# Patient Record
Sex: Female | Born: 1965 | Race: White | Hispanic: No | Marital: Married | Smoking: Never smoker
Health system: Southern US, Community
[De-identification: ages and names within clinical notes are randomized; demographics above are authoritative.]

## PROBLEM LIST (undated history)

## (undated) DIAGNOSIS — I10 Essential (primary) hypertension: Secondary | ICD-10-CM

## (undated) HISTORY — DX: Essential (primary) hypertension: I10

---

## 1987-01-18 HISTORY — PX: BREAST BIOPSY: SHX20

## 2004-11-01 ENCOUNTER — Ambulatory Visit: Payer: Self-pay

## 2005-12-09 ENCOUNTER — Ambulatory Visit: Payer: Self-pay

## 2007-03-28 ENCOUNTER — Ambulatory Visit: Payer: Self-pay

## 2008-10-28 ENCOUNTER — Ambulatory Visit: Payer: Self-pay

## 2008-11-11 ENCOUNTER — Ambulatory Visit: Payer: Self-pay

## 2010-02-22 ENCOUNTER — Ambulatory Visit: Payer: Self-pay

## 2011-04-19 ENCOUNTER — Ambulatory Visit: Payer: Self-pay | Admitting: Obstetrics and Gynecology

## 2012-09-21 ENCOUNTER — Ambulatory Visit: Payer: Self-pay

## 2014-05-13 DIAGNOSIS — I10 Essential (primary) hypertension: Secondary | ICD-10-CM | POA: Insufficient documentation

## 2014-07-10 DIAGNOSIS — Z8639 Personal history of other endocrine, nutritional and metabolic disease: Secondary | ICD-10-CM | POA: Insufficient documentation

## 2014-07-10 DIAGNOSIS — E782 Mixed hyperlipidemia: Secondary | ICD-10-CM | POA: Insufficient documentation

## 2014-07-18 ENCOUNTER — Other Ambulatory Visit: Payer: Self-pay | Admitting: Certified Nurse Midwife

## 2014-07-18 DIAGNOSIS — Z1231 Encounter for screening mammogram for malignant neoplasm of breast: Secondary | ICD-10-CM

## 2014-07-25 ENCOUNTER — Ambulatory Visit: Payer: Self-pay

## 2014-08-08 ENCOUNTER — Ambulatory Visit
Admission: RE | Admit: 2014-08-08 | Discharge: 2014-08-08 | Disposition: A | Discharge status: Home / Self Care | Payer: Managed Care, Other (non HMO) | Source: Ambulatory Visit | Attending: Certified Nurse Midwife | Admitting: Certified Nurse Midwife

## 2014-08-08 ENCOUNTER — Ambulatory Visit (INDEPENDENT_AMBULATORY_CARE_PROVIDER_SITE_OTHER): Payer: Managed Care, Other (non HMO) | Admitting: Urology

## 2014-08-08 ENCOUNTER — Encounter: Payer: Self-pay | Admitting: Urology

## 2014-08-08 VITALS — BP 148/83 | HR 78 | Ht 63.0 in | Wt 152.9 lb

## 2014-08-08 DIAGNOSIS — R928 Other abnormal and inconclusive findings on diagnostic imaging of breast: Secondary | ICD-10-CM | POA: Insufficient documentation

## 2014-08-08 DIAGNOSIS — R339 Retention of urine, unspecified: Secondary | ICD-10-CM | POA: Diagnosis not present

## 2014-08-08 DIAGNOSIS — Z1231 Encounter for screening mammogram for malignant neoplasm of breast: Secondary | ICD-10-CM | POA: Diagnosis present

## 2014-08-08 DIAGNOSIS — R350 Frequency of micturition: Secondary | ICD-10-CM

## 2014-08-08 DIAGNOSIS — R312 Other microscopic hematuria: Secondary | ICD-10-CM

## 2014-08-08 DIAGNOSIS — R3129 Other microscopic hematuria: Secondary | ICD-10-CM

## 2014-08-08 LAB — URINALYSIS, COMPLETE
BILIRUBIN UA: NEGATIVE
Glucose, UA: NEGATIVE
NITRITE UA: POSITIVE — AB
PH UA: 8.5 — AB (ref 5.0–7.5)
Specific Gravity, UA: 1.02 (ref 1.005–1.030)
UUROB: 0.2 mg/dL (ref 0.2–1.0)

## 2014-08-08 LAB — MICROSCOPIC EXAMINATION

## 2014-08-08 LAB — BLADDER SCAN AMB NON-IMAGING

## 2014-08-08 NOTE — Patient Instructions (Signed)
Hematuria Hematuria is blood in your urine. It can be caused by a bladder infection, kidney infection, prostate infection, kidney stone, or cancer of your urinary tract. Infections can usually be treated with medicine, and a kidney stone usually will pass your health care provider about any blood you see in your urine, even if the blood stops without treatment or happens without causing pain. Blood in your urine that happens and then stops and then happens again  reason may be needed. It is very important that you tell your health care provider about any blood you see in your urine, even if the blood stops without treatment or happens without causing pain. Bloodcan be a symptom of a very serious condition. Also, pain is not a symptom in the initial stages of many urinary cancers. HOME CARE INSTRUCTIONS   Drink lots of fluid, 3-4 quarts a day. If you have been diagnosed with an infection, cranberry juice is especially recommended, in addition to large amounts of water.  Avoid caffeine, tea, and carbonated beverages because they tend to irritate the bladder.  Avoid alcohol because it may irritate the prostate.  Take all medicines as directed by your health care provider.  If you were prescribed an antibiotic medicine, finish it all even if you start to feel better.  If you have been diagnosed with a kidney stone, follow your health care provider's instructions regarding straining your urine to catch the stone.  Empty your bladder often. Avoid holding urine for long periods of time.  After a bowel movement, women should cleanse front to back. Use each tissue only once.  Empty your bladder before and after sexual intercourse if you are a female. SEEK MEDICAL CARE IF:  You develop back pain.  You have a fever.  You have a feeling of sickness in your stomach (nausea) or vomiting.  Your symptoms are not better in 3 days. Return sooner if you are getting worse. SEEK IMMEDIATE MEDICAL CARE IF:     You develop severe vomiting and are unable to keep the medicine down.  You develop severe back or abdominal pain despite taking your medicines.  You begin passing a large amount of blood or clots in your urine.  You feel extremely weak or faint, or you pass out. MAKE SURE YOU:   Understand these instructions.  Will watch your condition.  Will get help right away if you are not doing well or get worse. Document Released: 01/03/2005 Document Revised: 05/20/2013 Document Reviewed: 09/03/2012 [facility] Patient Information 2015 [place], [place]. This information is not intended to replace advice given to you by your health care provider. Make sure you discuss any questions you have with your health care provider.

## 2014-08-08 NOTE — Progress Notes (Signed)
08/08/2014 11:46 AM   Bonnie Ingram 1965-05-28 [id number]  Referring provider: No referring provider defined for this encounter.  Chief Complaint  Patient presents with  . Hematuria    New Patient    HPI: 49 yo referred evaluation of microscopic hematuria.  She has been found to have evidence of microscopic hematuria in the absence of true infection on 3 separate occasions by her PCP.  She does report developing strong smelling urine but no other symptoms starting in 04/2014.  A UA was performed showing incidental microscopic hematuria. Although UA was not particularly suspicious, she was treated for presumed infection on this occasion. No urine culture available. She's had 2 other subsequent UAs which also show presence of microscopic hematuria without infection.  No smoking history.  No personal history of kidney stones but does have family history of kidney stones.  No family history of bladder cancer or kidney cancers.    She does have some mild burning with urination and increased frequency, urgency, and foul-smelling urine. This is been present over the past 3 days and she thinks she may have an infection.  At baseline, she denies any significant urinary symptoms other than occasional urinary frequency. She reports that she sometimes goes to the bathroom and will have to urinate just a few minutes thereafter. She is not sure that she is emptying her bladder fully.  She denies urinary incontinence.  UA results  07/17/2014 07/02/2014 05/09/2014    Light Yellow (A) Yellow Yellow  Clear Clear Clear  1.015 1.010 1.015  6.0 6.0 7.0  Negative Negative Negative  Negative Negative Negative  Negative Negative Negative  Large (A) Moderate (A) Moderate (A)  Negative Negative Negative  Small (A) Negative Negative  None Seen 0-3 0-3  4-10 (A) 4-10 (A) 4-10 (A)  Rare (A) None Seen None Seen  Rare None Seen Few   Color  Clarity  Specific Gravity  pH, Urine  Protein, Urinalysis    Glucose, Urinalysis  Ketones, Urinalysis  Blood, Urinalysis  Nitrite, Urinalysis  Leukocyte Esterase, Urinalysis  White Blood Cells, Urinalysis  Red Blood Cells, Urinalysis  Bacteria, Urinalysis  Squamous Epithelial Cells, Urinalysis      PMH: Past Medical History  Diagnosis Date  . Hypertension     Surgical History: Past Surgical History  Procedure Laterality Date  . Breast biopsy Right 1989    Benign  . Cesarean section  1993    Home Medications:    Medication List       This list is accurate as of: 08/08/14 11:59 PM.  Always use your most recent med list.               lisinopril-hydrochlorothiazide 20-12.5 MG per tablet  Commonly known as:  PRINZIDE,ZESTORETIC  Take by mouth.        Allergies: No Known Allergies  Family History: Family History  Problem Relation Age of Onset  . Breast cancer Maternal Aunt   . Bladder Cancer Maternal Uncle   . Nephrolithiasis Father   . Nephrolithiasis Maternal Grandfather   . Nephrolithiasis Sister     Social History:  reports that she has never smoked. She does not have any smokeless tobacco history on file. She reports that she does not drink alcohol or use illicit drugs.  ROS: UROLOGY Frequent Urination?: Yes Hard to postpone urination?: No Burning/pain with urination?: Yes Get up at night to urinate?: No Leakage of urine?: No Urine stream starts and stops?: Yes Trouble starting stream?: Yes Do  you have to strain to urinate?: Yes Blood in urine?: Yes Urinary tract infection?: Yes Sexually transmitted disease?: No Injury to kidneys or bladder?: No Painful intercourse?: No Weak stream?: No Currently pregnant?: No Vaginal bleeding?: No Last menstrual period?: 07-22-14  Gastrointestinal Nausea?: No Vomiting?: No Indigestion/heartburn?: No Diarrhea?: No Constipation?: Yes  Constitutional Fever: No Night sweats?: No Weight loss?: No Fatigue?: Yes  Skin Skin rash/lesions?: No Itching?:  No  Eyes Blurred vision?: No Double vision?: No  Ears/Nose/Throat Sore throat?: No Sinus problems?: No  Hematologic/Lymphatic Swollen glands?: No Easy bruising?: No  Cardiovascular Leg swelling?: No Chest pain?: No  Respiratory Cough?: No Shortness of breath?: No  Endocrine Excessive thirst?: No  Musculoskeletal Back pain?: No Joint pain?: No  Neurological Headaches?: No Dizziness?: No  Psychologic Depression?: No Anxiety?: No  Physical Exam: BP 148/83 mmHg  Pulse 78  Ht $R'5\' 3"'dr$  (1.6 m)  Wt 152 lb 14.4 oz (69.355 kg)  BMI 27.09 kg/m2  LMP 07/22/2014  Constitutional:  Alert and oriented, No acute distress. HEENT: [place] AT, moist mucus membranes.  Trachea midline, no masses. Cardiovascular: No clubbing, cyanosis, or edema. Respiratory: Normal respiratory effort, no increased work of breathing. GI: Abdomen is soft, nontender, nondistended, no abdominal masses GU: No CVA tenderness.  Skin: No rashes, bruises or suspicious lesions. Lymph: No cervical or inguinal adenopathy. Neurologic: Grossly intact, no focal deficits, moving all 4 extremities. Psychiatric: Normal mood and affect.  Laboratory Data: Comprehensive Metabolic Panel (CMP) - Final result (07/02/2014 8:41 AM) Comprehensive Metabolic Panel (CMP) - Final result (07/02/2014 8:41 AM)  Component Value Range  Glucose 87 70-110 mg/dL  Sodium 141 136-145 mmol/L  Potassium 4.3 3.6-5.1 mmol/L  Chloride 105 97-109 mmol/L  Carbon Dioxide (CO2) 26.5 22.0-32.0 mmol/L  Urea Nitrogen (BUN) 11 7-25 mg/dL  Creatinine 0.9 0.6-1.1 mg/dL  Glomerular Filtration Rate (eGFR), MDRD Estimate 67 >60 mL/min/1.73sq m  Calcium 10.0 8.7-10.3 mg/dL  AST  24 8-39 U/L  ALT  17 5-38 U/L  Alk Phos (alkaline Phosphatase) 44 34-104 U/L  Albumin 4.4 3.5-4.8 g/dL  Bilirubin, Total 0.5 0.3-1.2 mg/dL  Protein, Total 7.0 6.1-7.9 g/dL  A/G Ratio 1.7 1.0-5.0 gm/dL    Urinalysis Results for orders placed or performed in visit on  08/08/14  Microscopic Examination  Result Value Ref Range   WBC, UA 11-30 (A) 0 -  5 /hpf   RBC, UA 3-10 (A) 0 -  2 /hpf   Epithelial Cells (non renal) 0-10 0 - 10 /hpf   Bacteria, UA Many (A) None seen/Few  Urinalysis, Complete  Result Value Ref Range   Specific Gravity, UA 1.020 1.005 - 1.030   pH, UA 8.5 (H) 5.0 - 7.5   Color, UA Yellow Yellow   Appearance Ur Cloudy (A) Clear   Leukocytes, UA 1+ (A) Negative   Protein, UA 1+ (A) Negative/Trace   Glucose, UA Negative Negative   Ketones, UA 1+ (A) Negative   RBC, UA 2+ (A) Negative   Bilirubin, UA Negative Negative   Urobilinogen, Ur 0.2 0.2 - 1.0 mg/dL   Nitrite, UA Positive (A) Negative   Microscopic Examination See below:   BLADDER SCAN AMB NON-IMAGING  Result Value Ref Range   Scan Result 142ml     Pertinent Imaging: PVR 144 cc  Assessment & Plan: 49 year old nonsmoker with microscopic hematuria 3 in the absence of infection. Her UA today though is suspicious for infection and we will treat as needed.  1. Microscopic hematuria She does meet the AUA criteria for  workup. We discussed the differential diagnosis of microscopic hematuria today as well as the recommended follow-up including CT urogram and office cystoscopy. All of her questions were answered. - Urinalysis, Complete  2. Urinary frequency Mild urinary frequency and incomplete bladder emptying. See below. - BLADDER SCAN AMB NON-IMAGING  3. Incomplete bladder emptying Postvoid residual today is 144 which is elevated, etiology unclear. We'll perform pelvic exam at the time of cystoscopy to evaluate for presence of cystocele or any other anatomic issues. She was advised to double void as needed.   Return in about 4 weeks (around 09/05/2014) for cytoscopy, pelvic exam, review CTU.  Hollice Espy, MD  [facility] [address], [place], [place] [address] [phone]

## 2014-08-09 ENCOUNTER — Encounter: Payer: Self-pay | Admitting: Urology

## 2014-08-11 ENCOUNTER — Telehealth: Payer: Self-pay | Admitting: Urology

## 2014-08-11 LAB — CULTURE, URINE COMPREHENSIVE

## 2014-08-11 MED ORDER — CIPROFLOXACIN HCL 500 MG PO TABS: 500.0000 mg | ORAL_TABLET | Freq: Two times a day (BID) | ORAL | Status: DC

## 2014-08-11 NOTE — Telephone Encounter (Signed)
LMOM-medication called into pharmacy 

## 2014-08-11 NOTE — Telephone Encounter (Signed)
Please call and treat UTI with cipro 500 mg bid x 3 days.  Vanna [place], MD

## 2014-08-12 NOTE — Telephone Encounter (Signed)
LMOM

## 2014-08-13 ENCOUNTER — Telehealth: Payer: Self-pay

## 2014-08-13 NOTE — Telephone Encounter (Signed)
Unable to reach pt. Nurse called pt pharmacy who noted medication has been picked up.

## 2014-08-13 NOTE — Telephone Encounter (Signed)
Pt returned call stating she had picked up medication and is taking it.

## 2014-08-15 ENCOUNTER — Other Ambulatory Visit: Payer: Self-pay | Admitting: Certified Nurse Midwife

## 2014-08-15 DIAGNOSIS — R928 Other abnormal and inconclusive findings on diagnostic imaging of breast: Secondary | ICD-10-CM

## 2014-08-15 DIAGNOSIS — N6310 Unspecified lump in the right breast, unspecified quadrant: Secondary | ICD-10-CM

## 2014-08-18 ENCOUNTER — Other Ambulatory Visit: Payer: Self-pay | Admitting: Certified Nurse Midwife

## 2014-08-18 DIAGNOSIS — N63 Unspecified lump in unspecified breast: Secondary | ICD-10-CM

## 2014-08-18 DIAGNOSIS — R928 Other abnormal and inconclusive findings on diagnostic imaging of breast: Secondary | ICD-10-CM

## 2014-08-20 ENCOUNTER — Ambulatory Visit
Admission: RE | Admit: 2014-08-20 | Discharge: 2014-08-20 | Disposition: A | Discharge status: Home / Self Care | Payer: Managed Care, Other (non HMO) | Source: Ambulatory Visit | Attending: Certified Nurse Midwife | Admitting: Certified Nurse Midwife

## 2014-08-20 DIAGNOSIS — N63 Unspecified lump in unspecified breast: Secondary | ICD-10-CM

## 2014-08-20 DIAGNOSIS — R928 Other abnormal and inconclusive findings on diagnostic imaging of breast: Secondary | ICD-10-CM

## 2014-08-26 ENCOUNTER — Other Ambulatory Visit: Payer: Managed Care, Other (non HMO)

## 2014-08-26 ENCOUNTER — Ambulatory Visit: Payer: Managed Care, Other (non HMO)

## 2014-08-27 ENCOUNTER — Other Ambulatory Visit: Payer: Self-pay | Admitting: Internal Medicine

## 2014-08-27 DIAGNOSIS — R3129 Other microscopic hematuria: Secondary | ICD-10-CM

## 2014-08-29 ENCOUNTER — Other Ambulatory Visit: Payer: Self-pay | Admitting: Internal Medicine

## 2014-08-29 ENCOUNTER — Ambulatory Visit
Admission: RE | Admit: 2014-08-29 | Discharge: 2014-08-29 | Disposition: A | Discharge status: Home / Self Care | Payer: Managed Care, Other (non HMO) | Source: Ambulatory Visit | Attending: Internal Medicine | Admitting: Internal Medicine

## 2014-08-29 DIAGNOSIS — R911 Solitary pulmonary nodule: Secondary | ICD-10-CM

## 2014-08-29 DIAGNOSIS — R312 Other microscopic hematuria: Secondary | ICD-10-CM | POA: Diagnosis present

## 2014-08-29 DIAGNOSIS — R3129 Other microscopic hematuria: Secondary | ICD-10-CM

## 2014-08-29 MED ORDER — IOHEXOL 350 MG/ML SOLN
125.0000 mL | Freq: Once | INTRAVENOUS | Status: AC | PRN
  Administered 2014-08-29: 125 mL via INTRAVENOUS

## 2014-09-11 ENCOUNTER — Other Ambulatory Visit: Payer: Managed Care, Other (non HMO)

## 2014-12-02 ENCOUNTER — Ambulatory Visit: Admission: RE | Admit: 2014-12-02 | Payer: Managed Care, Other (non HMO) | Source: Ambulatory Visit

## 2015-07-15 ENCOUNTER — Other Ambulatory Visit: Payer: Self-pay | Admitting: Internal Medicine

## 2015-07-15 DIAGNOSIS — R911 Solitary pulmonary nodule: Secondary | ICD-10-CM

## 2015-07-22 ENCOUNTER — Ambulatory Visit: Payer: Managed Care, Other (non HMO)

## 2015-08-04 ENCOUNTER — Ambulatory Visit: Admission: RE | Admit: 2015-08-04 | Payer: Managed Care, Other (non HMO) | Source: Ambulatory Visit

## 2015-12-07 ENCOUNTER — Other Ambulatory Visit: Payer: Self-pay | Admitting: Certified Nurse Midwife

## 2015-12-07 DIAGNOSIS — N63 Unspecified lump in unspecified breast: Secondary | ICD-10-CM

## 2016-01-01 ENCOUNTER — Ambulatory Visit
Admission: RE | Admit: 2016-01-01 | Discharge: 2016-01-01 | Disposition: A | Discharge status: Home / Self Care | Payer: Managed Care, Other (non HMO) | Source: Ambulatory Visit | Attending: Certified Nurse Midwife | Admitting: Certified Nurse Midwife

## 2016-01-01 DIAGNOSIS — N63 Unspecified lump in unspecified breast: Secondary | ICD-10-CM

## 2016-01-01 DIAGNOSIS — N6310 Unspecified lump in the right breast, unspecified quadrant: Secondary | ICD-10-CM | POA: Insufficient documentation

## 2016-01-18 HISTORY — PX: TOOTH EXTRACTION: SUR596

## 2016-03-18 IMAGING — US US BREAST LTD UNI RIGHT INC AXILLA
1 series · 2 of 2 positions shown · non-contrast
Comparison: 08/08/2014, 09/21/2012, additional prior studies dating
back to 11/01/2004

CLINICAL DATA: 49-year-old female, callback from screening
mammogram for possible right breast mass. The patient has a history
of a prior benign right excisional biopsy in her 20s demonstrating a
fibroadenoma.

EXAM:
DIGITAL DIAGNOSTIC RIGHT MAMMOGRAM WITH 3D TOMOSYNTHESIS WITH CAD
ULTRASOUND RIGHT BREAST

[Series 1: us breast ltd uni right inc axilla · 0.08mm/px · 2 of 2 slices shown]
[im 1/2]
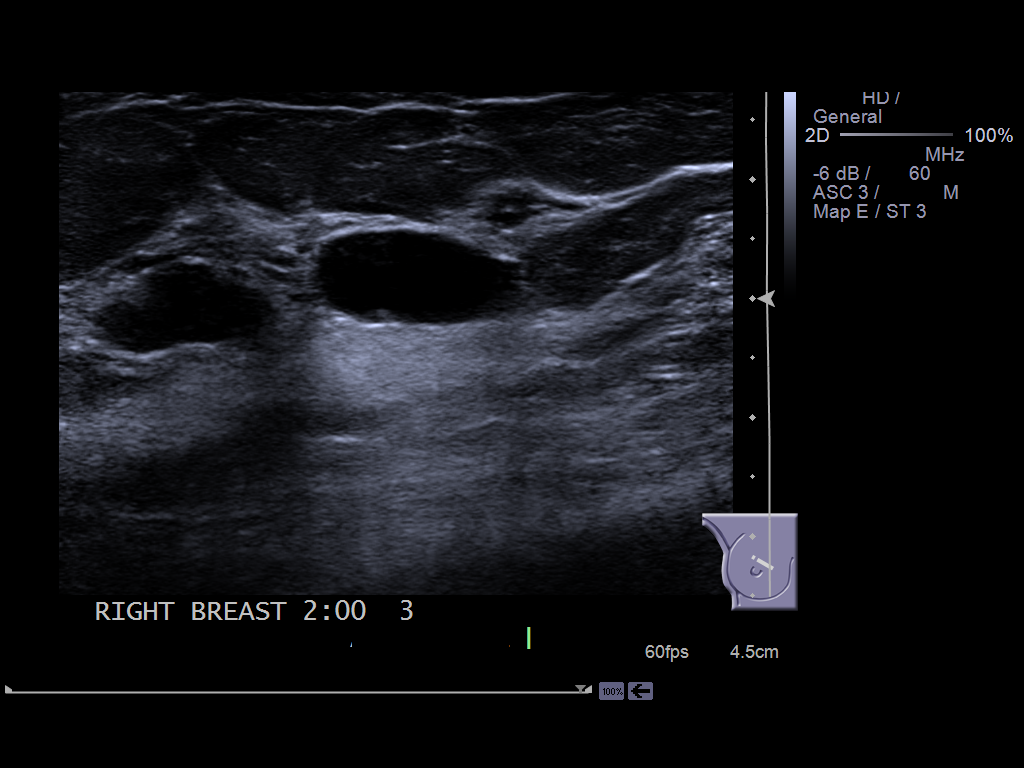
[im 2/2]
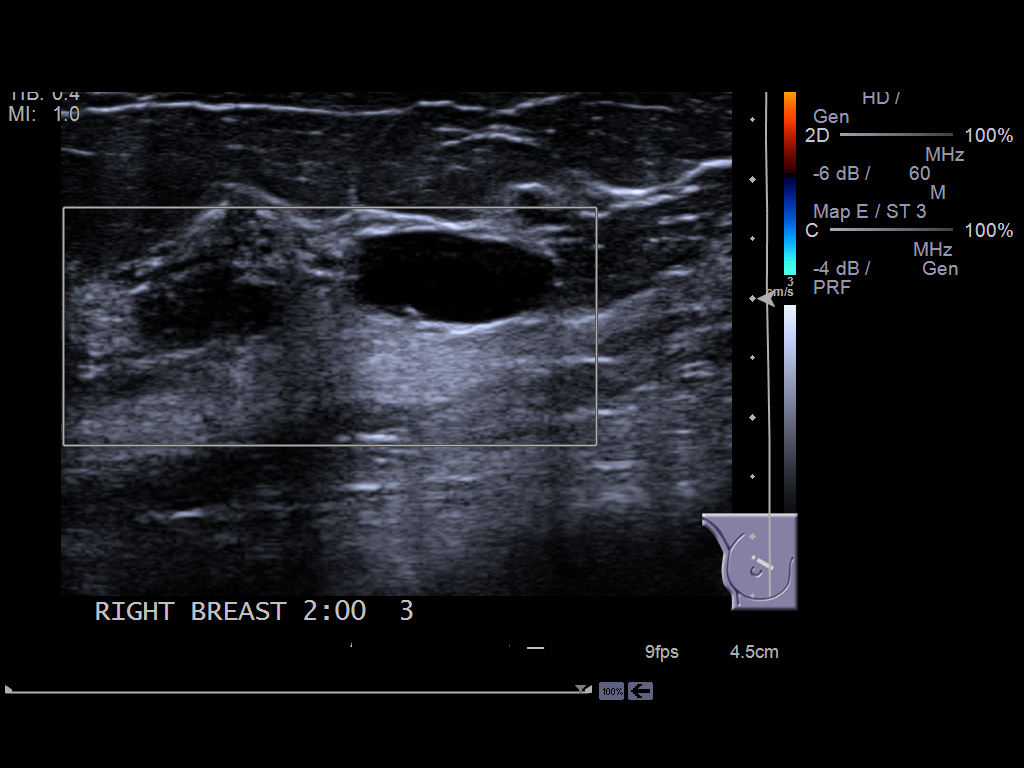

[2 of 2 positions shown; findings below may reference images not displayed]

ACR Breast Density Category c: The breast tissue is heterogeneously
dense, which may obscure small masses.
FINDINGS: Cc and MLO views of the right breast with tomosynthesis were
performed. In the area of concern identified on screening mammogram,
there are several adjacent oval circumscribed masses. Postsurgical
changes from prior excisional biopsy are noted within the lateral
right breast.

Mammographic images were processed with CAD.

On physical exam, no discrete mass is felt in the area of concern
within the medial right breast.

Targeted ultrasound is performed, showing a cyst at 2 o'clock, 3 cm
from the nipple measuring 18 x 8 x 19 mm, corresponding to the
largest oval mass within the medial right breast on mammogram.
Adjacent to this cyst at 12 o'clock, 3 cm from the nipple, there is
an oval, circumscribed, hypoechoic mass measuring 16 x 15 x 7 mm,
corresponding to the slightly smaller oval mass seen
mammographically within the medial right breast. This mass is
thought to likely represent a fibroadenoma. An additional cyst is
noted at 12 o'clock, 1 cm from the nipple measuring 6 x 6 x 7 mm. No
suspicious sonographic finding is identified.
IMPRESSION: Probably benign right breast mass at 12 o'clock, 3 cm from the
nipple.

RECOMMENDATION:
Right diagnostic mammogram and possible ultrasound in 6 months.

I have discussed the findings and recommendations with the patient.
Results were also provided in writing at the conclusion of the
visit. If applicable, a reminder letter will be sent to the patient
regarding the next appointment.

BI-RADS CATEGORY  3: Probably benign.

## 2017-07-30 IMAGING — US US BREAST*R* LIMITED INC AXILLA
1 series · 8 of 8 positions shown · non-contrast
Comparison: Previous exam(s).

CLINICAL DATA: This patient was to have a short-term followup for a
probably benign mass in the right breast. This mass was evaluated in
August 2014. This is her first follow-up since that exam.

EXAM:
2D DIGITAL DIAGNOSTIC BILATERAL MAMMOGRAM WITH CAD AND ADJUNCT TOMO
ULTRASOUND RIGHT BREAST

[Series 1: us breast*right* limited inc axilla · 0.07mm/px · 8 of 8 slices shown]
[im 1/8]
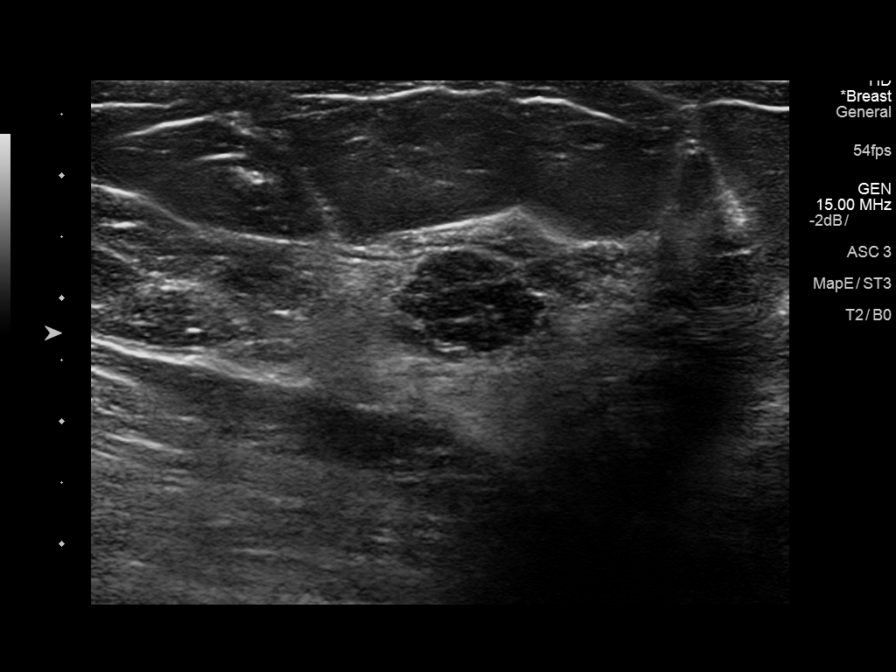
[im 2/8]
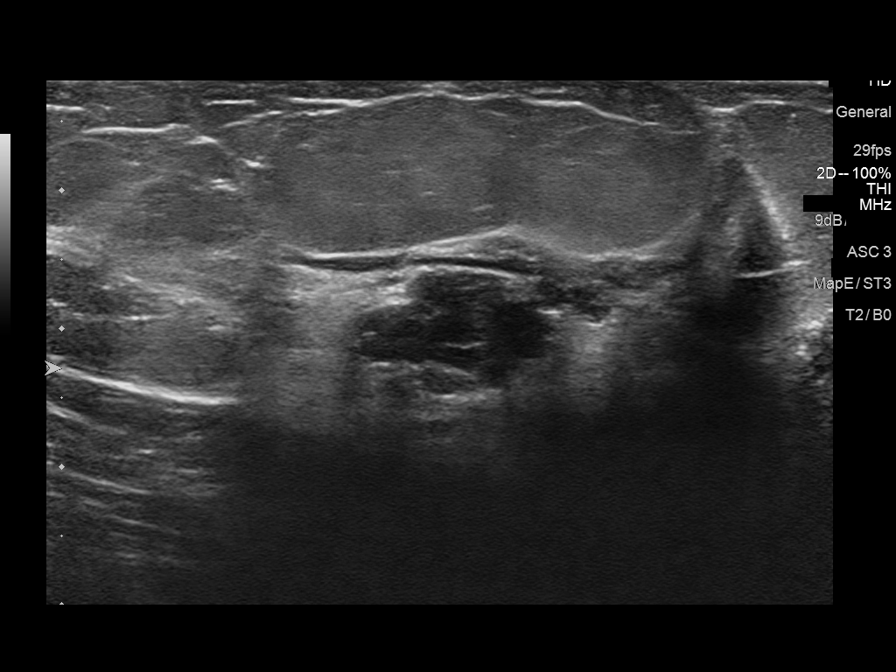
[im 3/8]
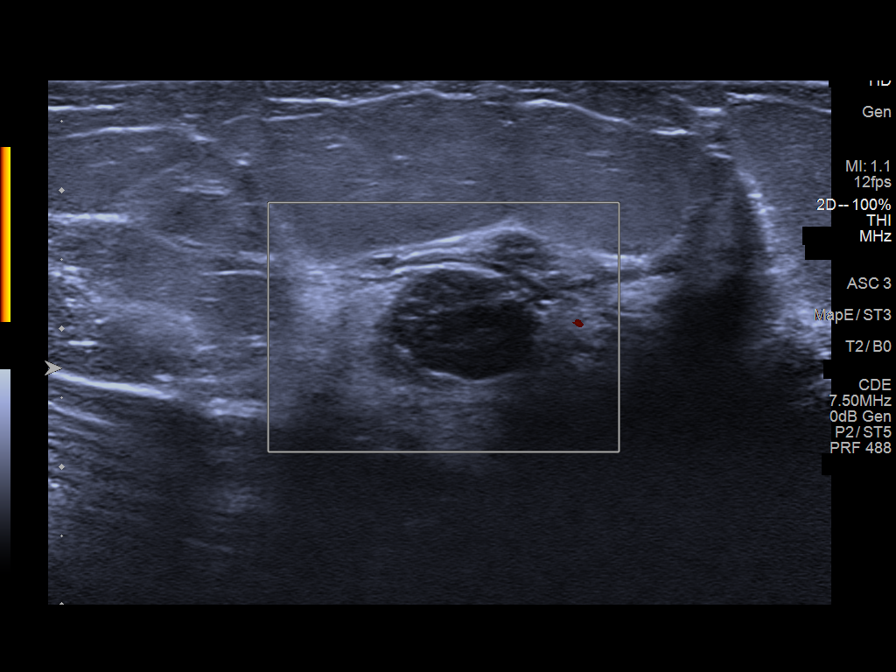
[im 4/8]
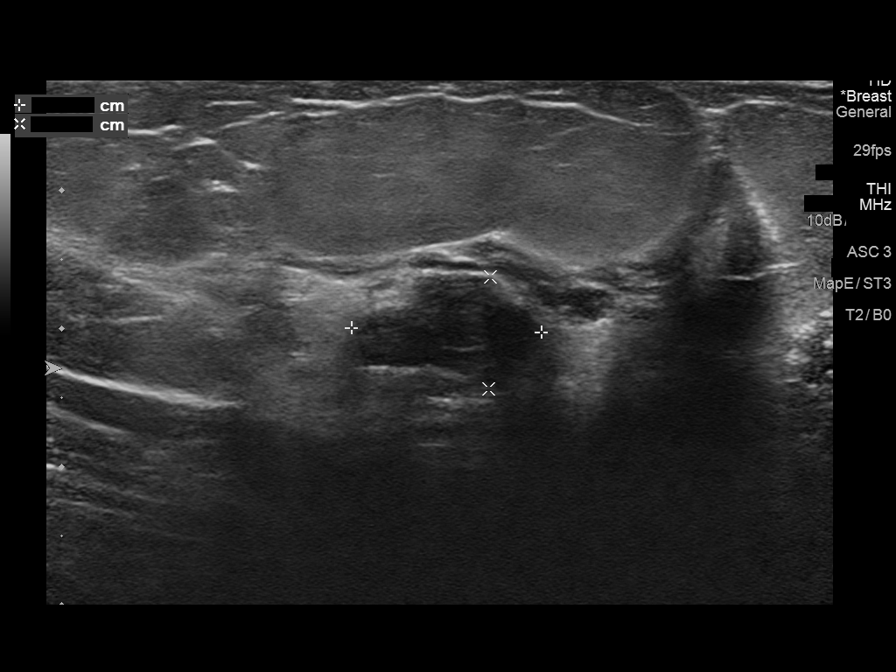
[im 5/8]
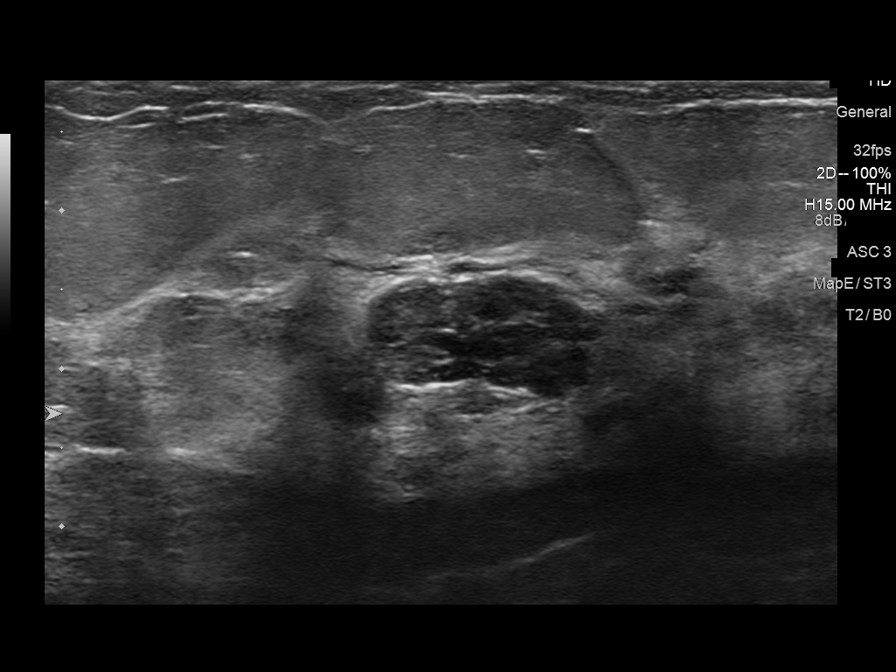
[im 6/8]
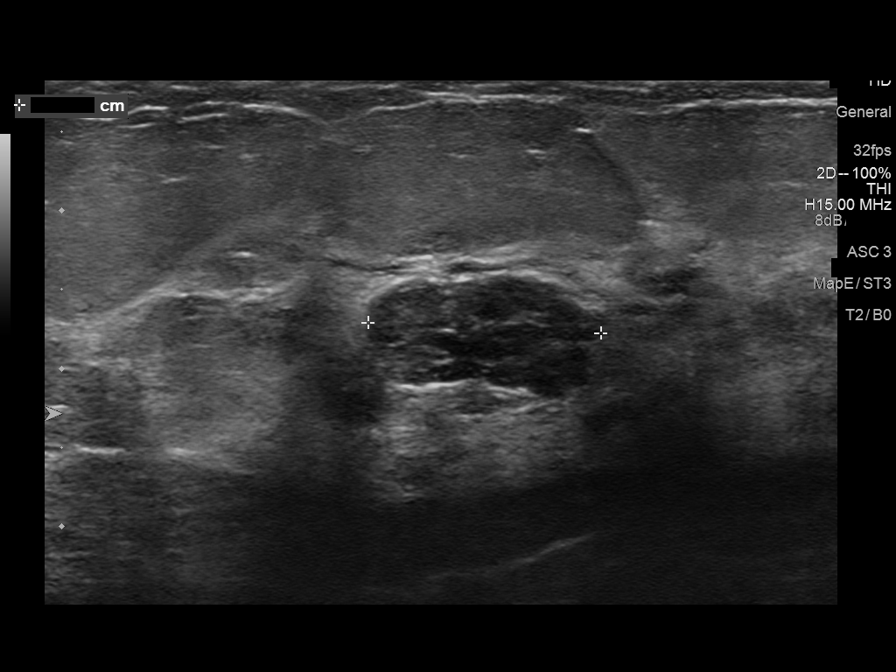
[im 7/8]
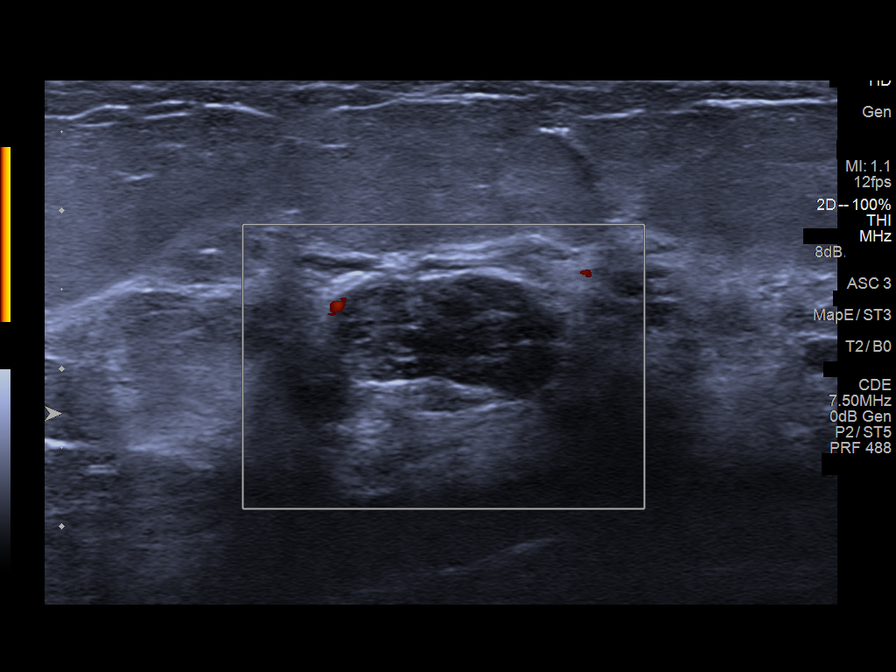
[im 8/8]
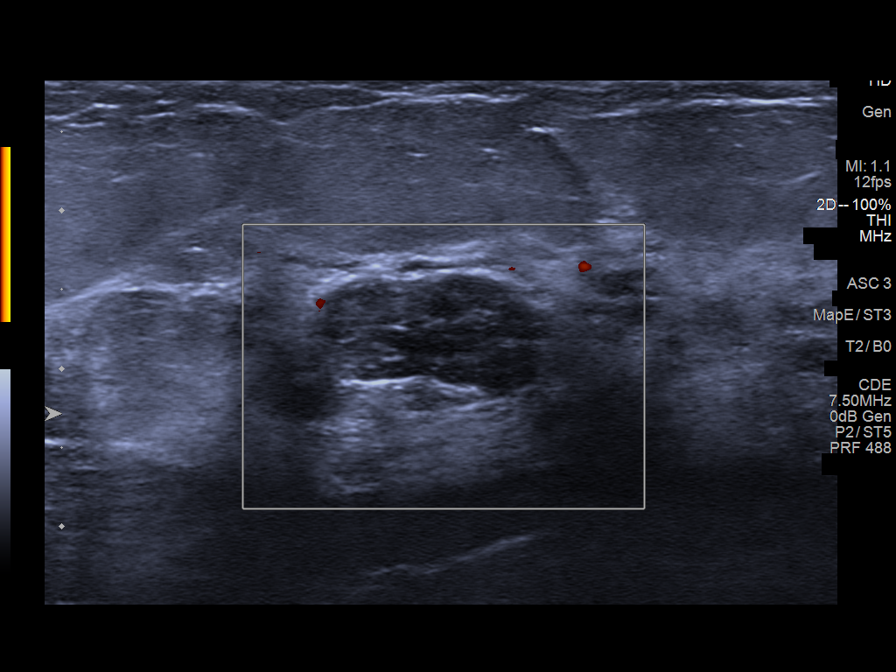

[8 of 8 positions shown; findings below may reference images not displayed]

ACR Breast Density Category c: The breast tissue is heterogeneously
dense, which may obscure small masses.
FINDINGS: There are multiple bilateral circumscribed masses, without change
from the prior exam. One oval mass in the upper inner right breast
is smaller. There are no convincing new masses, no areas of
architectural distortion and no suspicious calcifications.

Mammographic images were processed with CAD.

Targeted ultrasound is performed, showing a hypoechoic,
circumscribed mass with mildly lobulated margins in the right breast
at 12:30 o'clock, 3 cm the nipple, measuring 14 x 8 x 15 mm, stable
from the prior ultrasound.
IMPRESSION: 1. Probably benign mass in the right breast stable for 16 months. No
new abnormalities.

RECOMMENDATION:
Diagnostic mammography with right breast ultrasound in 1 year to
document greater than 2 years of stability for the probably benign
right breast mass.

I have discussed the findings and recommendations with the patient.
Results were also provided in writing at the conclusion of the
visit. If applicable, a reminder letter will be sent to the patient
regarding the next appointment.

BI-RADS CATEGORY  3: Probably benign.

## 2018-10-16 ENCOUNTER — Encounter: Payer: Self-pay | Admitting: Emergency Medicine

## 2018-10-16 ENCOUNTER — Other Ambulatory Visit: Payer: Self-pay

## 2018-10-16 ENCOUNTER — Emergency Department: Payer: 59

## 2018-10-16 ENCOUNTER — Emergency Department
Admission: EM | Admit: 2018-10-16 | Discharge: 2018-10-16 | Disposition: A | Discharge status: Home / Self Care | Payer: 59 | Attending: Emergency Medicine | Admitting: Emergency Medicine

## 2018-10-16 DIAGNOSIS — S81812A Laceration without foreign body, left lower leg, initial encounter: Secondary | ICD-10-CM

## 2018-10-16 DIAGNOSIS — W182XXA Fall in (into) shower or empty bathtub, initial encounter: Secondary | ICD-10-CM | POA: Diagnosis not present

## 2018-10-16 DIAGNOSIS — I10 Essential (primary) hypertension: Secondary | ICD-10-CM | POA: Insufficient documentation

## 2018-10-16 DIAGNOSIS — Y92002 Bathroom of unspecified non-institutional (private) residence single-family (private) house as the place of occurrence of the external cause: Secondary | ICD-10-CM | POA: Insufficient documentation

## 2018-10-16 DIAGNOSIS — Y93E1 Activity, personal bathing and showering: Secondary | ICD-10-CM | POA: Insufficient documentation

## 2018-10-16 DIAGNOSIS — Z79899 Other long term (current) drug therapy: Secondary | ICD-10-CM | POA: Insufficient documentation

## 2018-10-16 DIAGNOSIS — Y999 Unspecified external cause status: Secondary | ICD-10-CM | POA: Diagnosis not present

## 2018-10-16 DIAGNOSIS — S8012XA Contusion of left lower leg, initial encounter: Secondary | ICD-10-CM

## 2018-10-16 DIAGNOSIS — S8992XA Unspecified injury of left lower leg, initial encounter: Secondary | ICD-10-CM | POA: Diagnosis present

## 2018-10-16 DIAGNOSIS — Z23 Encounter for immunization: Secondary | ICD-10-CM | POA: Insufficient documentation

## 2018-10-16 MED ORDER — TETANUS-DIPHTH-ACELL PERTUSSIS 5-2.5-18.5 LF-MCG/0.5 IM SUSP
0.5000 mL | Freq: Once | INTRAMUSCULAR | Status: AC
  Administered 2018-10-16: 0.5 mL via INTRAMUSCULAR
  Filled 2018-10-16: qty 0.5

## 2018-10-16 MED ORDER — CEPHALEXIN 500 MG PO CAPS: 1000.0000 mg | ORAL_CAPSULE | Freq: Two times a day (BID) | ORAL | 0 refills | Status: DC

## 2018-10-16 MED ORDER — MELOXICAM 15 MG PO TABS: 15.0000 mg | ORAL_TABLET | Freq: Every day | ORAL | 0 refills | Status: DC

## 2018-10-16 NOTE — ED Provider Notes (Signed)
[facility] Emergency Department Provider Note  ____________________________________________  Time seen: Approximately 10:04 PM  I have reviewed the triage vital signs and the nursing notes.   HISTORY  Chief Complaint Leg Injury    HPI Bonnie Ingram is a 53 y.o. female who presents the emergency department for evaluation of an injury to the left anterior shin.  Patient reports that she was attempting to get into the shower tonight when 1 foot slipped as she stepped over the edge of her shower.  Patient fell landing on her left shin.  Patient did sustain a superficial laceration but was able to easily control bleeding.  Patient did not hit her head or lose consciousness.  She is currently complaining of anterior shin pain and laceration.  Unsure last tetanus shot.  No other injury or complaint at this time.  No medications prior to arrival.         Past Medical History:  Diagnosis Date  . Hypertension     Patient Active Problem List   Diagnosis Date Noted  . H/O nutritional disorder 07/10/2014  . 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 Essential (primary) hypertension 05/13/2014    Past Surgical History:  Procedure Laterality Date  . BREAST BIOPSY Right 1989   Benign  . CESAREAN SECTION  1993  . TOOTH EXTRACTION  2018    Prior to Admission medications   Medication Sig Start Date End Date Taking? Authorizing Provider  cephALEXin (KEFLEX) 500 MG capsule Take 2 capsules (1,000 mg total) by mouth 2 (two) times daily. 10/16/18   , Charline Bills, PA-C  ciprofloxacin (CIPRO) 500 MG tablet Take 1 tablet (500 mg total) by mouth 2 (two) times daily. 08/11/14   Hollice Espy, MD  lisinopril-hydrochlorothiazide (PRINZIDE,ZESTORETIC) 20-12.5 MG per tablet Take by mouth.    [provider]  meloxicam (MOBIC) 15 MG tablet Take 1 tablet (15 mg total) by mouth daily. 10/16/18   , Charline Bills, PA-C     Allergies Patient has no known allergies.  Family History  Problem Relation Age of Onset  . Breast cancer Maternal Aunt   . Bladder Cancer Maternal Uncle   . Nephrolithiasis Father   . Nephrolithiasis Maternal Grandfather   . Nephrolithiasis Sister     Social History Social History   Tobacco Use  . Smoking status: Never Smoker  . Smokeless tobacco: Never Used  Substance Use Topics  . Alcohol use: No    Alcohol/week: 0.0 standard drinks  . Drug use: No     Review of Systems  Constitutional: No fever/chills Eyes: No visual changes. No discharge ENT: No upper respiratory complaints. Cardiovascular: no chest pain. Respiratory: no cough. No SOB. Gastrointestinal: No abdominal pain.  No nausea, no vomiting.  No diarrhea.  No constipation. Musculoskeletal: Left lower extremity injury Skin: Negative for rash, abrasions, lacerations, ecchymosis. Neurological: Negative for headaches, focal weakness or numbness. 10-point ROS otherwise negative.  ____________________________________________   PHYSICAL EXAM:  VITAL SIGNS: ED Triage Vitals  Enc Vitals Group     BP 10/16/18 2134 135/68     Pulse Rate 10/16/18 2134 79     Resp 10/16/18 2134 20     Temp 10/16/18 2134 99 F (37.2 C)     Temp Source 10/16/18 2134 Oral     SpO2 10/16/18 2134 99 %     Weight 10/16/18 2135 158 lb (71.7 kg)     Height 10/16/18 2135 5\' 3"  (1.6 m)     Head Circumference --  Peak Flow --      Pain Score 10/16/18 2135 5     Pain Loc --      Pain Edu? --      Excl. in GC? --      Constitutional: Alert and oriented. Well appearing and in no acute distress. Eyes: Conjunctivae are normal. PERRL. EOMI. Head: Atraumatic. Neck: No stridor.    Cardiovascular: Normal rate, regular rhythm. Normal S1 and S2.  Good peripheral circulation. Respiratory: Normal respiratory effort without tachypnea or retractions. Lungs CTAB. Good air entry to the bases with no decreased or absent breath  sounds. Musculoskeletal: Full range of motion to all extremities. No gross deformities appreciated.  Visualization of the left lower extremity reveals no deformity.  Superficial laceration running proximal to distal identified mid tibia.  No active bleeding.  No foreign body.  Edges are well approximated.  Very superficial in nature.  Examination of the knee and ankle is unremarkable.  Special tests of the knee is unremarkable.  Dorsalis pedis pulse intact distally.  Sensation intact distally. Neurologic:  Normal speech and language. No gross focal neurologic deficits are appreciated.  Skin:  Skin is warm, dry and intact. No rash noted. Psychiatric: Mood and affect are normal. Speech and behavior are normal. Patient exhibits appropriate insight and judgement.   ____________________________________________   LABS (all labs ordered are listed, but only abnormal results are displayed)  Labs Reviewed - No data to display ____________________________________________  EKG   ____________________________________________  RADIOLOGY I personally viewed and evaluated these images as part of my medical decision making, as well as reviewing the written report by the radiologist.  Dg Tibia/fibula Left  Result Date: 10/16/2018 CLINICAL DATA:  53 year old female with left lower extremity pain and swelling following trauma. EXAM: LEFT TIBIA AND FIBULA - 2 VIEW COMPARISON:  None. FINDINGS: There is no acute fracture or dislocation. The bones are well mineralized. No significant arthritic changes. The soft tissues are unremarkable. IMPRESSION: Negative. Electronically Signed   By: Elgie Collard M.D.   On: 10/16/2018 22:06    ____________________________________________    PROCEDURES  Procedure(s) performed:    Marland KitchenMarland KitchenLaceration Repair  Date/Time: 10/16/2018 10:18 PM Performed by: Racheal Patches, PA-C Authorized by: Racheal Patches, PA-C   Consent:    Consent obtained:  Verbal    Consent given by:  Patient   Risks discussed:  Pain Anesthesia (see MAR for exact dosages):    Anesthesia method:  None Laceration details:    Location:  Leg   Leg location:  L lower leg   Length (cm):  6   Depth (mm):  2 Repair type:    Repair type:  Simple Exploration:    Hemostasis achieved with:  Direct pressure   Wound exploration: wound explored through full range of motion and entire depth of wound probed and visualized     Wound extent: no foreign bodies/material noted, no muscle damage noted, no nerve damage noted, no tendon damage noted, no underlying fracture noted and no vascular damage noted     Contaminated: no   Treatment:    Area cleansed with:  Shur-Clens   Amount of cleaning:  Standard   Irrigation solution:  Sterile saline Post-procedure details:    Dressing:  Non-adherent dressing   Patient tolerance of procedure:  Tolerated well, no immediate complications      Medications  Tdap (BOOSTRIX) injection 0.5 mL (has no administration in time range)     ____________________________________________   INITIAL IMPRESSION / ASSESSMENT AND  PLAN / ED COURSE  Pertinent labs & imaging results that were available during my care of the patient were reviewed by me and considered in my medical decision making (see chart for details).  Review of the [place] CSRS was performed in accordance of the [facility] prior to dispensing any controlled drugs.           Patient's diagnosis is consistent with left lower extremity injury.  Patient presents emergency department after falling on her left shin.  Patient sustained a superficial laceration to the mid shin.  This is cleansed and covered as described above.  Superficial and does not require closure at this time.  Imaging reveals no acute osseous abnormality.  Patient will be updated with her tetanus shot today.  Antibiotics prophylactically.  Follow-up primary care as needed..  Patient is given ED precautions to return to the ED for  any worsening or new symptoms.     ____________________________________________  FINAL CLINICAL IMPRESSION(S) / ED DIAGNOSES  Final diagnoses:  Contusion of left lower extremity, initial encounter  Laceration of left lower extremity, initial encounter      NEW MEDICATIONS STARTED DURING THIS VISIT:  ED Discharge Orders         Ordered    meloxicam (MOBIC) 15 MG tablet  Daily     10/16/18 2225    cephALEXin (KEFLEX) 500 MG capsule  2 times daily     10/16/18 2225              This chart was dictated using voice recognition software/Dragon. Despite best efforts to proofread, errors can occur which can change the meaning. Any change was purely unintentional.    Racheal PatchesCuthriell,  D, PA-C 10/16/18 2225    Phineas SemenGoodman, [place], MD 10/16/18 2249

## 2018-10-16 NOTE — ED Notes (Signed)
See triage note.  Pt reports slipping in the shower, hitting left shin in the shower, c/o skin tear and swelling to shin. Pt is ambulatory but c/o pain. Pt states ecchymosis is present.  Leg currently wrapped with gauze.

## 2018-10-16 NOTE — ED Triage Notes (Signed)
Patient states she slipped trying to get in the shower. States she caught herself on wall, but left leg hit lower wall getting into the shower.

## 2019-05-04 ENCOUNTER — Ambulatory Visit: Payer: 59 | Attending: Internal Medicine

## 2019-05-04 DIAGNOSIS — Z23 Encounter for immunization: Secondary | ICD-10-CM

## 2019-05-04 NOTE — Progress Notes (Signed)
   Covid-19 Vaccination Clinic  Name:  Bonnie Ingram    MRN: [id number] DOB: 1965-08-26  05/04/2019  Ms. Bonnie Ingram was observed post Covid-19 immunization for 15 minutes without incident. She was provided with Vaccine Information Sheet and instruction to access the V-Safe system.   Ms. Bonnie Ingram was instructed to call 911 with any severe reactions post vaccine: Marland Kitchen Difficulty breathing  . Swelling of face and throat  . A fast heartbeat  . A bad rash all over body  . Dizziness and weakness   Immunizations Administered    Name Date Dose VIS Date Route   Pfizer COVID-19 Vaccine 05/04/2019 11:09 AM 0.3 mL 12/28/2018 Intramuscular   Manufacturer: [employer], [employer]   Lot: [id number]   NDC: [address]-7

## 2019-05-29 ENCOUNTER — Ambulatory Visit: Payer: 59 | Attending: Internal Medicine

## 2019-05-29 DIAGNOSIS — Z23 Encounter for immunization: Secondary | ICD-10-CM

## 2019-05-29 NOTE — Progress Notes (Signed)
   Covid-19 Vaccination Clinic  Name:  Bonnie Ingram    MRN: [id number] DOB: 1965/04/15  05/29/2019  [name] was observed post Covid-19 immunization for 15 minutes without incident. She was provided with Vaccine Information Sheet and instruction to access the V-Safe system.   [name] was instructed to call 911 with any severe reactions post vaccine: Marland Kitchen Difficulty breathing  . Swelling of face and throat  . A fast heartbeat  . A bad rash all over body  . Dizziness and weakness   Immunizations Administered    Name Date Dose VIS Date Route   Pfizer COVID-19 Vaccine 05/29/2019  9:30 AM 0.3 mL 03/13/2018 Intramuscular   Manufacturer: [employer], [employer]   Lot: [id number]   NDC: [address]-3

## 2020-05-27 ENCOUNTER — Encounter: Payer: Self-pay | Admitting: Emergency Medicine

## 2020-05-27 ENCOUNTER — Other Ambulatory Visit: Payer: Self-pay

## 2020-05-27 ENCOUNTER — Emergency Department
Admission: EM | Admit: 2020-05-27 | Discharge: 2020-05-28 | Disposition: A | Discharge status: Home / Self Care | Payer: 59 | Attending: Emergency Medicine | Admitting: Emergency Medicine

## 2020-05-27 ENCOUNTER — Emergency Department: Payer: 59

## 2020-05-27 DIAGNOSIS — Z79899 Other long term (current) drug therapy: Secondary | ICD-10-CM | POA: Insufficient documentation

## 2020-05-27 DIAGNOSIS — I10 Essential (primary) hypertension: Secondary | ICD-10-CM | POA: Diagnosis not present

## 2020-05-27 DIAGNOSIS — R0602 Shortness of breath: Secondary | ICD-10-CM | POA: Insufficient documentation

## 2020-05-27 DIAGNOSIS — R072 Precordial pain: Secondary | ICD-10-CM | POA: Diagnosis not present

## 2020-05-27 DIAGNOSIS — R079 Chest pain, unspecified: Secondary | ICD-10-CM

## 2020-05-27 DIAGNOSIS — R202 Paresthesia of skin: Secondary | ICD-10-CM | POA: Diagnosis not present

## 2020-05-27 LAB — CBC WITH DIFFERENTIAL/PLATELET
Abs Immature Granulocytes: 0.01 10*3/uL (ref 0.00–0.07)
Basophils Absolute: 0.1 10*3/uL (ref 0.0–0.1)
Basophils Relative: 1 %
Eosinophils Absolute: 0.1 10*3/uL (ref 0.0–0.5)
Eosinophils Relative: 1 %
HCT: 42.7 % (ref 36.0–46.0)
Hemoglobin: 15.1 g/dL — ABNORMAL HIGH (ref 12.0–15.0)
Immature Granulocytes: 0 %
Lymphocytes Relative: 45 %
Lymphs Abs: 4 10*3/uL (ref 0.7–4.0)
MCH: 31.5 pg (ref 26.0–34.0)
MCHC: 35.4 g/dL (ref 30.0–36.0)
MCV: 89 fL (ref 80.0–100.0)
Monocytes Absolute: 0.8 10*3/uL (ref 0.1–1.0)
Monocytes Relative: 9 %
Neutro Abs: 4 10*3/uL (ref 1.7–7.7)
Neutrophils Relative %: 44 %
Platelets: 263 10*3/uL (ref 150–400)
RBC: 4.8 MIL/uL (ref 3.87–5.11)
RDW: 12.1 % (ref 11.5–15.5)
WBC: 9 10*3/uL (ref 4.0–10.5)
nRBC: 0 % (ref 0.0–0.2)

## 2020-05-27 LAB — BASIC METABOLIC PANEL
Anion gap: 10 (ref 5–15)
BUN: 21 mg/dL — ABNORMAL HIGH (ref 6–20)
CO2: 28 mmol/L (ref 22–32)
Calcium: 10.1 mg/dL (ref 8.9–10.3)
Chloride: 101 mmol/L (ref 98–111)
Creatinine, Ser: 1.03 mg/dL — ABNORMAL HIGH (ref 0.44–1.00)
GFR, Estimated: >60 mL/min (ref >60)
Glucose, Bld: 116 mg/dL — ABNORMAL HIGH (ref 70–99)
Potassium: 3.3 mmol/L — ABNORMAL LOW (ref 3.5–5.1)
Sodium: 139 mmol/L (ref 135–145)

## 2020-05-27 LAB — TROPONIN I (HIGH SENSITIVITY): Troponin I (High Sensitivity): 3 ng/L (ref <18)

## 2020-05-27 MED ORDER — ASPIRIN 81 MG PO CHEW
243.0000 mg | CHEWABLE_TABLET | Freq: Once | ORAL | Status: AC
  Administered 2020-05-28: 243 mg via ORAL
  Filled 2020-05-27: qty 3

## 2020-05-27 NOTE — ED Provider Notes (Signed)
Emergency Medicine Provider Triage Evaluation Note  Bonnie Ingram , a 55 y.o. female  was evaluated in triage.  Pt complains of chest tightness starting this evening radiating into her neck and back.  Pain seems to be easing up following arrival to the ED.  Review of Systems  Positive: Chest pain and tightness Negative: Fevers, cough, shortness of breath, nausea, vomiting, abdominal pain.  Physical Exam  BP (!) 130/52 (BP Location: Left Arm)   Pulse 83   Temp 98.1 F (36.7 C) (Oral)   Resp 16   Ht 5\' 3"  (1.6 m)   Wt 73.5 kg   LMP 10/09/2018 (Approximate)   SpO2 98%   BMI 28.70 kg/m  Gen:   Awake, no distress   Resp:  Normal effort  MSK:   Moves extremities without difficulty  Medical Decision Making  Medically screening exam initiated at 10:49 PM.  Appropriate orders placed.  Bonnie Ingram was informed that the remainder of the evaluation will be completed by another provider, this initial triage assessment does not replace that evaluation, and the importance of remaining in the ED until their evaluation is complete.    Reatha Harps, MD 05/27/20 2253

## 2020-05-27 NOTE — ED Triage Notes (Signed)
Pt to ED from home c/o central chest pain starting tonight, left arm numbness and numbness into neck and back, some SOB but denies cough, fever, or n/v/d.  Pt A&Ox4, chest rise even and unlabored in triage, sensation and strength equal bilaterally.

## 2020-05-28 LAB — TROPONIN I (HIGH SENSITIVITY): Troponin I (High Sensitivity): 3 ng/L (ref <18)

## 2020-05-28 MED ORDER — ASPIRIN EC 81 MG PO TBEC: 81.0000 mg | DELAYED_RELEASE_TABLET | Freq: Every day | ORAL | 2 refills | Status: AC

## 2020-05-28 NOTE — Discharge Instructions (Signed)

## 2020-05-28 NOTE — ED Provider Notes (Signed)
[facility] Emergency Department Provider Note  ____________________________________________  Time seen: Approximately 1:07 AM  I have reviewed the triage vital signs and the nursing notes.   HISTORY  Chief Complaint Chest Pain and Numbness   HPI Bonnie Ingram is a 55 y.o. female history of hypertension who presents for evaluation of chest pain.  Patient reports that she was in her closet trying to pick her outfit for tomorrow when she started having chest pain.  She describes the pain as pressure substernally radiating up to her neck, towards her back and down her left arm and associated with left arm numbness.  She also felt some shortness of breath with it.  She said the symptoms lasted about 30 to 40 minutes and subsided by the time she came to the emergency room.  No nausea, no dizziness.  Patient denies ever having similar symptoms.  She does report history of panic attacks but those are usually when she feels claustrophobic.  She denies that this felt like a panic attack.  She has family history of heart disease in her father and grandparents.  No history of smoking.  No personal or family history of PE or DVT, no recent travel immobilization, no leg pain or swelling, no hemoptysis or exogenous hormones.   Past Medical History:  Diagnosis Date  . Hypertension     Patient Active Problem List   Diagnosis Date Noted  . H/O nutritional disorder 07/10/2014  . 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 Essential (primary) hypertension 05/13/2014    Past Surgical History:  Procedure Laterality Date  . BREAST BIOPSY Right 1989   Benign  . CESAREAN SECTION  1993  . TOOTH EXTRACTION  2018    Prior to Admission medications   Medication Sig Start Date End Date Taking? Authorizing Provider  aspirin EC 81 MG tablet Take 1 tablet (81 mg total) by mouth daily. Swallow whole. 05/28/20 05/28/21 Yes Lariah Fleer, [place], MD  Cholecalciferol 125  MCG (5000 UT) capsule Take 5,000 Units by mouth daily.    [provider]  olmesartan-hydrochlorothiazide (BENICAR HCT) 20-12.5 MG tablet Take 1 tablet by mouth daily. 04/09/20   [provider]  Omega-3 1000 MG CAPS Take 1 capsule by mouth 2 (two) times a week.    [provider]    Allergies Patient has no known allergies.  Family History  Problem Relation Age of Onset  . Breast cancer Maternal Aunt   . Bladder Cancer Maternal Uncle   . Nephrolithiasis Father   . Nephrolithiasis Maternal Grandfather   . Nephrolithiasis Sister     Social History Social History   Tobacco Use  . Smoking status: Never Smoker  . Smokeless tobacco: Never Used  Substance Use Topics  . Alcohol use: No    Alcohol/week: 0.0 standard drinks  . Drug use: No    Review of Systems  Constitutional: Negative for fever. Eyes: Negative for visual changes. ENT: Negative for sore throat. Neck: No neck pain  Cardiovascular: + chest pain. Respiratory: Negative for shortness of breath. Gastrointestinal: Negative for abdominal pain, vomiting or diarrhea. Genitourinary: Negative for dysuria. Musculoskeletal: Negative for back pain. Skin: Negative for rash. Neurological: Negative for headaches, weakness or numbness. Psych: No SI or HI  ____________________________________________   PHYSICAL EXAM:  VITAL SIGNS: ED Triage Vitals  Enc Vitals Group     BP 05/27/20 2234 (!) 130/52     Pulse Rate 05/27/20 2234 83     Resp 05/27/20 2234  16     Temp 05/27/20 2234 98.1 F (36.7 C)     Temp Source 05/27/20 2234 Oral     SpO2 05/27/20 2234 98 %     Weight 05/27/20 2235 162 lb (73.5 kg)     Height 05/27/20 2235 5\' 3"  (1.6 m)     Head Circumference --      Peak Flow --      Pain Score 05/27/20 2234 3     Pain Loc --      Pain Edu? --      Excl. in GC? --     Constitutional: Alert and oriented. Well appearing and in no apparent distress. HEENT:      Head: Normocephalic and  atraumatic.         Eyes: Conjunctivae are normal. Sclera is non-icteric.       Mouth/Throat: Mucous membranes are moist.       Neck: Supple with no signs of meningismus. Cardiovascular: Regular rate and rhythm. No murmurs, gallops, or rubs. 2+ symmetrical distal pulses are present in all extremities. No JVD. Respiratory: Normal respiratory effort. Lungs are clear to auscultation bilaterally.  Gastrointestinal: Soft, non tender, and non distended with positive bowel sounds. No rebound or guarding. Genitourinary: No CVA tenderness. Musculoskeletal:  No edema, cyanosis, or erythema of extremities. Neurologic: Normal speech and language. Face is symmetric. Moving all extremities. No gross focal neurologic deficits are appreciated. Skin: Skin is warm, dry and intact. No rash noted. Psychiatric: Mood and affect are normal. Speech and behavior are normal.  ____________________________________________   LABS (all labs ordered are listed, but only abnormal results are displayed)  Labs Reviewed  CBC WITH DIFFERENTIAL/PLATELET - Abnormal; Notable for the following components:      Result Value   Hemoglobin 15.1 (*)    All other components within normal limits  BASIC METABOLIC PANEL - Abnormal; Notable for the following components:   Potassium 3.3 (*)    Glucose, Bld 116 (*)    BUN 21 (*)    Creatinine, Ser 1.03 (*)    All other components within normal limits  TROPONIN I (HIGH SENSITIVITY)  TROPONIN I (HIGH SENSITIVITY)   ____________________________________________  EKG  ED ECG REPORT I, 2235, the attending physician, personally viewed and interpreted this ECG.  Normal sinus rhythm, rate of 85, normal intervals, normal axis, no ST elevations or depressions. ____________________________________________  RADIOLOGY  I have personally reviewed the images performed during this visit and I agree with the Radiologist's read.   Interpretation by Radiologist:  DG Chest 2  View  Result Date: 05/27/2020 CLINICAL DATA:  Chest pain EXAM: CHEST - 2 VIEW COMPARISON:  None. FINDINGS: Cardiac shadow is within normal limits. Lungs are well aerated bilaterally. A nodular density is noted overlying the left base. This may represent a nipple shadow as this is not well seen on the lateral projection. Repeat frontal film with nipple markers is recommended. IMPRESSION: Question nipple shadow on the left. Repeat frontal film with nipple markers. Electronically Signed   By: 07/27/2020 M.D.   On: 05/27/2020 23:34     ____________________________________________   PROCEDURES  Procedure(s) performed:yes .1-3 Lead EKG Interpretation Performed by: 07/27/2020, MD Authorized by: Nita Sickle, MD     Interpretation: non-specific     ECG rate assessment: normal     Rhythm: sinus rhythm     Ectopy: none     Conduction: normal     Critical Care performed:  None ____________________________________________   INITIAL  IMPRESSION / ASSESSMENT AND PLAN / ED COURSE   55 y.o. female history of hypertension who presents for evaluation of substernal pressure radiating to the neck, upper back, and left arm associated with left arm numbness and shortness of breath lasting 40 minutes.  Upon arrival to the ED patient is pain-free. Heart score of 4.  She reports taking an 81 mg aspirin at home.  3 more were given here.  Initial EKG with no signs of ischemia.   Ddx ACS vs GERD vs panic attack. Less likely PE with no risk factors, no tachycardia, no tachypnea, no hypoxia, and resolved symptoms.  Less likely dissection with fully resolved symptoms, normal neurological exam, normal mediastinum silhouette, normal BP.  Patient placed on telemetry for close monitoring of cardiorespiratory status.  Old medical records were reviewed.  Plan to cycle troponin Q2hr x 2. CXR and labs pending.  _________________________ 2:02 AM on  05/28/2020 -----------------------------------------  Patient remains chest pain-free for almost 4 hours.  2 high-sensitivity troponins were negative.  At this time with negative work-up and no further episodes of pain I believe patient is stable for discharge and follow-up with cardiology.  Recommended starting a baby aspirin due to patient's family history.  Discussed results of the work-up, outpatient recommendations, my standard return precautions with patient and her sister who is at bedside.     _____________________________________________ Please note:  Patient was evaluated in Emergency Department today for the symptoms described in the history of present illness. Patient was evaluated in the context of the global COVID-19 pandemic, which necessitated consideration that the patient might be at risk for infection with the SARS-CoV-2 virus that causes COVID-19. Institutional protocols and algorithms that pertain to the evaluation of patients at risk for COVID-19 are in a state of rapid change based on information released by regulatory bodies including the CDC and federal and state organizations. These policies and algorithms were followed during the patient's care in the ED.  Some ED evaluations and interventions may be delayed as a result of limited staffing during the pandemic.   [place] Controlled Substance Database was reviewed by me. ____________________________________________   FINAL CLINICAL IMPRESSION(S) / ED DIAGNOSES   Final diagnoses:  Chest pain, unspecified type      NEW MEDICATIONS STARTED DURING THIS VISIT:  ED Discharge Orders         Ordered    aspirin EC 81 MG tablet  Daily        05/28/20 0202           Note:  This document was prepared using Dragon voice recognition software and may include unintentional dictation errors.    Nita Sickle, MD 05/28/20 [phone]

## 2020-10-10 IMAGING — CR DG TIBIA/FIBULA 2V*L*
1 series · 2 of 2 positions shown · non-contrast
Comparison: None.

CLINICAL DATA: 53-year-old female with left lower extremity pain
and swelling following trauma.

EXAM:
LEFT TIBIA AND FIBULA - 2 VIEW

[Series 1: dg tibia/fibula left · 0.14mm/px · 2 of 2 slices shown]
[im 1/2]
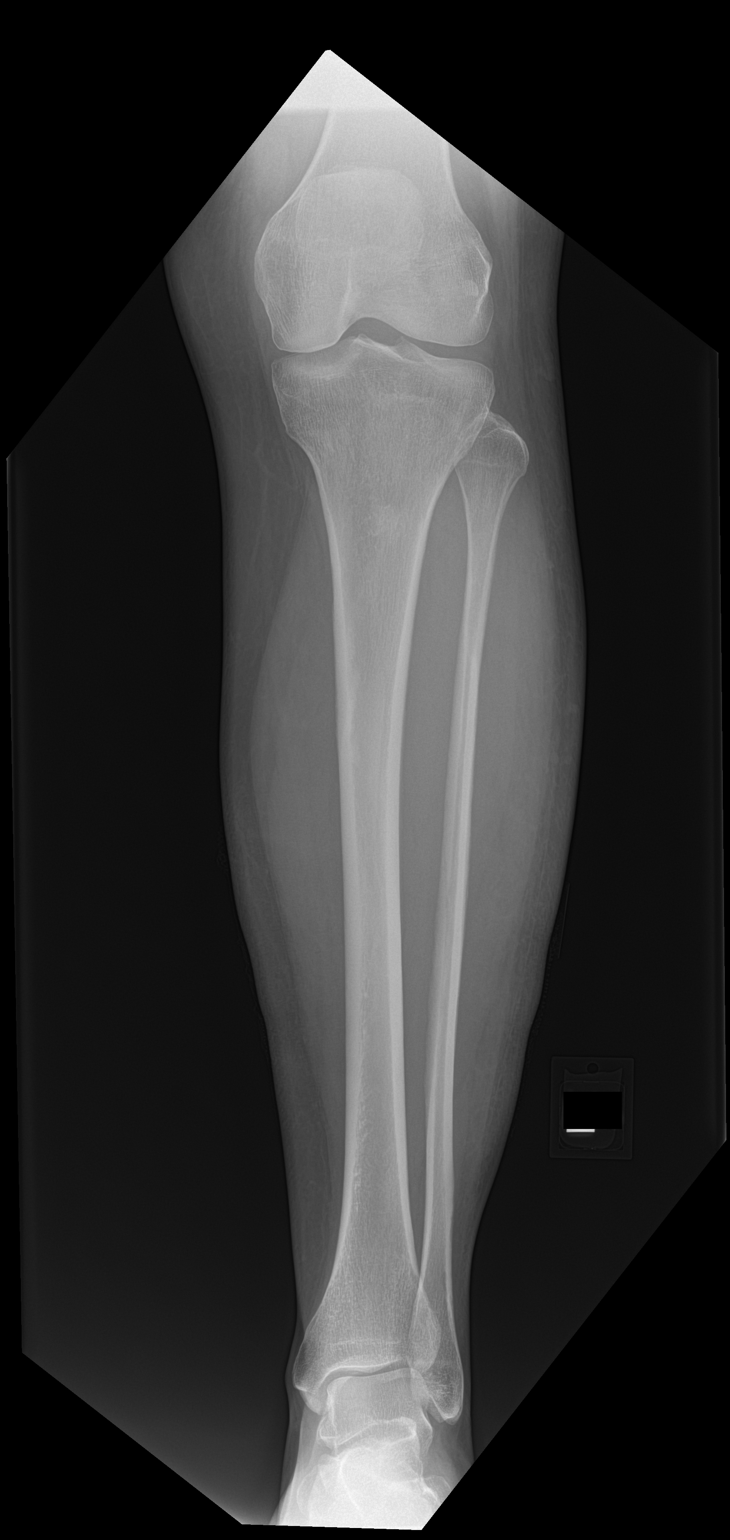
[im 2/2]
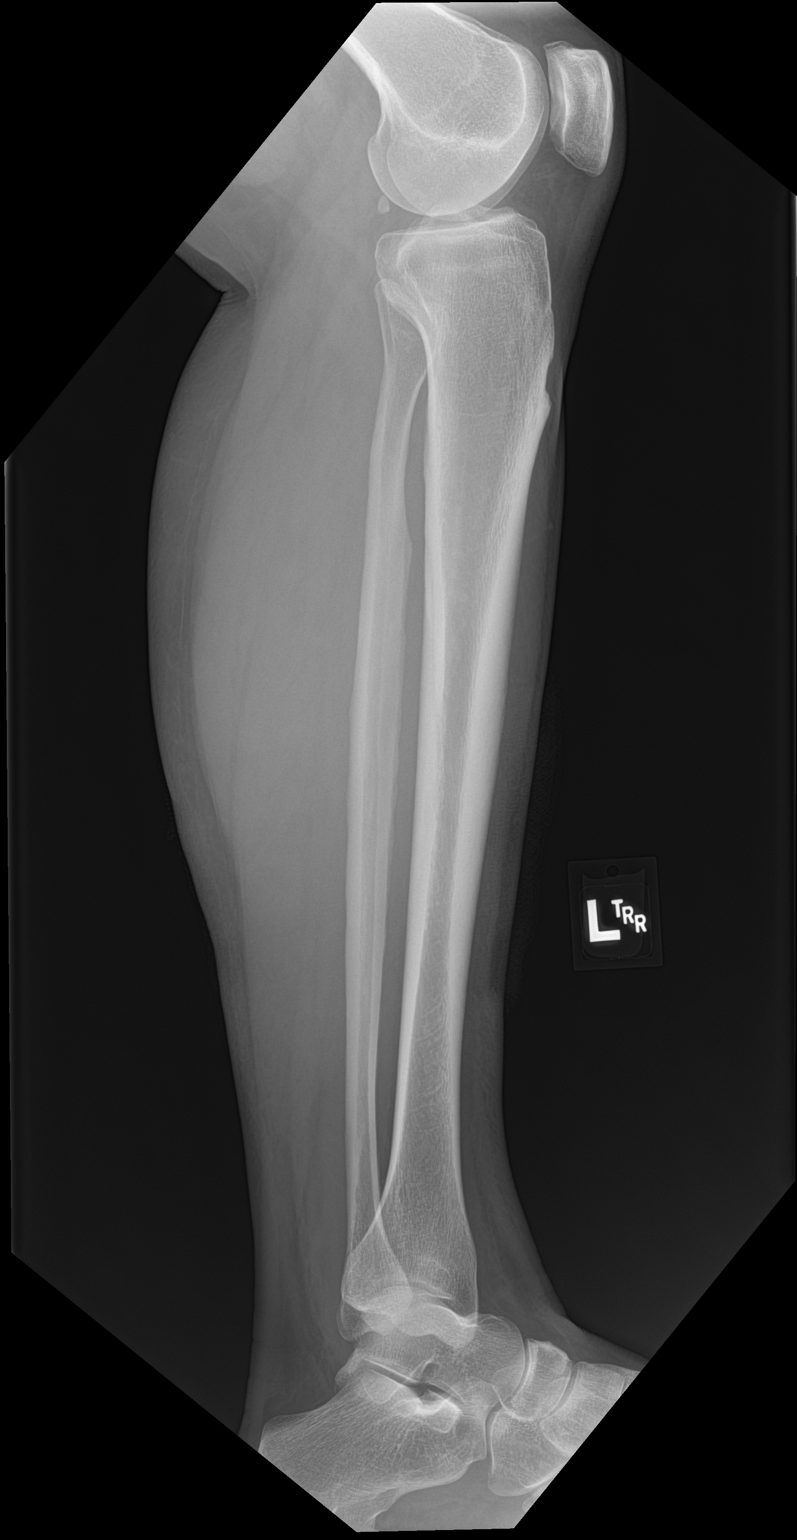

[2 of 2 positions shown; findings below may reference images not displayed]

FINDINGS: There is no acute fracture or dislocation. The bones are well
mineralized. No significant arthritic changes. The soft tissues are
unremarkable.
IMPRESSION: Negative.

## 2021-08-04 ENCOUNTER — Other Ambulatory Visit: Payer: Self-pay

## 2021-08-04 DIAGNOSIS — R399 Unspecified symptoms and signs involving the genitourinary system: Secondary | ICD-10-CM

## 2021-08-05 NOTE — Progress Notes (Signed)
08/06/21 11:01 AM   Bonnie Ingram [birth year] [id number]  Referring provider:  Danella Penton, MD [phone] [facility] [address] [facility] [place],  [place] [address] Chief Complaint  Patient presents with   Recurrent UTI     HPI: Bonnie Ingram is a 56 y.o.female who presents today for further evaluation of hematruia.   She was last seen here in clinic in 2016 by me. She was noted to have a UTI.   She has a personal history of hematuria and was previously followed by [facility] urology  and had negative workup in 2017/2018. She also has a personal history of nephrolithiasis CT in 2017 visualized a small stone the lower pole portion of the RIGHT kidney. She was noted to have a had a very small urethral meatus on cystoscopy with Dr Achilles Dunk. She also has a personal history of incomplete bladder emptying.   She was seen by her PCP, Dr Hyacinth Meeker on 07/14/2021 UA showed large blood, 10-50 RBCs, and rare bacteria. He referred her to urology.   She reports that she does not have any issued with her stone. She reports that she has episodes of gross hematuria that she was unsure if it was from her menstrual her in her urine. She reports that she started noticing bladder leakage when she had gotten allergies and was coughing; she had to start using panty liners because she leaked when she coughed, sneezed, etc.    PMH: Past Medical History:  Diagnosis Date   Hypertension     Surgical History: Past Surgical History:  Procedure Laterality Date   BREAST BIOPSY Right 1989   Benign   CESAREAN SECTION  1993   TOOTH EXTRACTION  2018    Home Medications:  Allergies as of 08/06/2021       Reactions   Peanut Oil Rash   *PEANUT BUTTER TO EXCESS        Medication List        Accurate as of August 06, 2021 11:01 AM. If you have any questions, ask your nurse or doctor.          Cholecalciferol 125 MCG (5000 UT) capsule Take 5,000 Units by mouth daily.   doxycycline 100 MG  tablet Commonly known as: VIBRA-TABS Take 100 mg by mouth 2 (two) times daily.   olmesartan-hydrochlorothiazide 20-12.5 MG tablet Commonly known as: BENICAR HCT Take 1 tablet by mouth daily.   Omega-3 1000 MG Caps Take 1 capsule by mouth 2 (two) times a week.        Allergies:  Allergies  Allergen Reactions   Peanut Oil Rash    *PEANUT BUTTER TO EXCESS    Family History: Family History  Problem Relation Age of Onset   Breast cancer Maternal Aunt    Bladder Cancer Maternal Uncle    Nephrolithiasis Father    Nephrolithiasis Maternal Grandfather    Nephrolithiasis Sister     Social History:  reports that she has never smoked. She has never used smokeless tobacco. She reports that she does not drink alcohol and does not use drugs.   Physical Exam: BP 121/77   Pulse 73   Ht 5\' 3"  (1.6 m)   Wt 160 lb 12.8 oz (72.9 kg)   LMP 10/09/2018 (Approximate)   BMI 28.48 kg/m   Constitutional:  Alert and oriented, No acute distress. HEENT: [place] AT, moist mucus membranes.  Trachea midline, no masses. Cardiovascular: No clubbing, cyanosis, or edema. Respiratory: Normal respiratory effort, no increased work  of breathing. Skin: No rashes, bruises or suspicious lesions. Neurologic: Grossly intact, no focal deficits, moving all 4 extremities. Psychiatric: Normal mood and affect.  Laboratory Data:  Lab Results  Component Value Date   CREATININE 1.03 (H) 05/27/2020   No results found for: "HGBA1C"  Urinalysis Component     Latest Ref Rng 08/06/2021  Color, Urine     YELLOW  YELLOW   Appearance     CLEAR  CLEAR   Specific Gravity, Urine     1.005 - 1.030  >1.030 (H)   pH     5.0 - 8.0  5.5   Glucose, UA     NEGATIVE mg/dL NEGATIVE   Hgb urine dipstick     NEGATIVE  LARGE !   Bilirubin Urine     NEGATIVE  SMALL !   Ketones, ur     NEGATIVE mg/dL NEGATIVE   Protein     NEGATIVE mg/dL 30 !   Nitrite     NEGATIVE  NEGATIVE   Leukocytes,Ua     NEGATIVE  NEGATIVE    Squamous Epithelial / LPF     0 - 5  0-5   WBC, UA     0 - 5 WBC/hpf 6-10   RBC / HPF     0 - 5 RBC/hpf >50   Bacteria, UA     NONE SEEN  MANY !   Hyaline Casts, UA PRESENT   Amorphous Crystal PRESENT     Legend: (H) High ! Abnormal   Pertinent Imaging: Post Void Residual  08/06/2021  Scan Result 28 ml      Assessment & Plan:    Microscopic hematuria - We discussed the differential diagnosis for microscopic hematuria including nephrolithiasis, renal or upper tract tumors, bladder stones, UTIs, or bladder tumors as well as undetermined etiologies. -I did recommend RUS and in office cystoscopy. She is agreeable with this plan.   2. Stress urinary incontinence  - We discussed conservative management versus physical therapy. Recommend conservative management as her symptoms are only exacerbated by allergies.   F/u RUS/ cysto  I,Kailey Littlejohn,acting as a scribe for Vanna Scotland, MD.,have documented all relevant documentation on the behalf of Vanna Scotland, MD,as directed by  Vanna Scotland, MD while in the presence of Vanna Scotland, MD.  I have reviewed the above documentation for accuracy and completeness, and I agree with the above.   Vanna Scotland, MD   [facility] [address], [address] [place], [place] [address] [phone]

## 2021-08-06 ENCOUNTER — Ambulatory Visit: Payer: Managed Care, Other (non HMO) | Admitting: Urology

## 2021-08-06 ENCOUNTER — Other Ambulatory Visit
Admission: RE | Admit: 2021-08-06 | Discharge: 2021-08-06 | Disposition: A | Discharge status: Home / Self Care | Payer: 59 | Attending: Urology | Admitting: Urology

## 2021-08-06 VITALS — BP 121/77 | HR 73 | Ht 63.0 in | Wt 160.8 lb

## 2021-08-06 DIAGNOSIS — N393 Stress incontinence (female) (male): Secondary | ICD-10-CM

## 2021-08-06 DIAGNOSIS — R3129 Other microscopic hematuria: Secondary | ICD-10-CM

## 2021-08-06 DIAGNOSIS — R399 Unspecified symptoms and signs involving the genitourinary system: Secondary | ICD-10-CM

## 2021-08-06 LAB — URINALYSIS, COMPLETE (UACMP) WITH MICROSCOPIC
Glucose, UA: NEGATIVE mg/dL
Ketones, ur: NEGATIVE mg/dL
Leukocytes,Ua: NEGATIVE
Nitrite: NEGATIVE
Protein, ur: 30 mg/dL — AB
RBC / HPF: >50 RBC/hpf (ref 0–5)
Specific Gravity, Urine: >1.03 — ABNORMAL HIGH (ref 1.005–1.030)
pH: 5.5 (ref 5.0–8.0)

## 2021-08-06 LAB — BLADDER SCAN AMB NON-IMAGING: Scan Result: 28

## 2021-08-06 NOTE — Patient Instructions (Signed)

## 2021-08-07 LAB — URINE CULTURE: Culture: NO GROWTH

## 2021-08-17 ENCOUNTER — Other Ambulatory Visit: Payer: 59

## 2021-08-18 ENCOUNTER — Ambulatory Visit
Admission: RE | Admit: 2021-08-18 | Discharge: 2021-08-18 | Disposition: A | Discharge status: Home / Self Care | Payer: 59 | Source: Ambulatory Visit | Attending: Urology | Admitting: Urology

## 2021-08-18 DIAGNOSIS — R399 Unspecified symptoms and signs involving the genitourinary system: Secondary | ICD-10-CM | POA: Insufficient documentation

## 2021-09-10 ENCOUNTER — Other Ambulatory Visit: Payer: Managed Care, Other (non HMO) | Admitting: Urology

## 2021-09-24 ENCOUNTER — Other Ambulatory Visit: Payer: Managed Care, Other (non HMO) | Admitting: Urology

## 2021-10-14 ENCOUNTER — Other Ambulatory Visit: Payer: Self-pay | Admitting: *Deleted

## 2021-10-14 DIAGNOSIS — R399 Unspecified symptoms and signs involving the genitourinary system: Secondary | ICD-10-CM

## 2021-10-15 ENCOUNTER — Other Ambulatory Visit
Admission: RE | Admit: 2021-10-15 | Discharge: 2021-10-15 | Disposition: A | Discharge status: Home / Self Care | Payer: 59 | Attending: Urology | Admitting: Urology

## 2021-10-15 ENCOUNTER — Encounter: Payer: Self-pay | Admitting: Urology

## 2021-10-15 ENCOUNTER — Ambulatory Visit: Payer: 59 | Admitting: Urology

## 2021-10-15 VITALS — BP 127/74 | HR 67 | Ht 63.0 in | Wt 162.0 lb

## 2021-10-15 DIAGNOSIS — R3129 Other microscopic hematuria: Secondary | ICD-10-CM | POA: Diagnosis not present

## 2021-10-15 DIAGNOSIS — R399 Unspecified symptoms and signs involving the genitourinary system: Secondary | ICD-10-CM | POA: Insufficient documentation

## 2021-10-15 LAB — URINALYSIS, COMPLETE (UACMP) WITH MICROSCOPIC
Bacteria, UA: NONE SEEN
Bilirubin Urine: NEGATIVE
Glucose, UA: NEGATIVE mg/dL
Ketones, ur: NEGATIVE mg/dL
Leukocytes,Ua: NEGATIVE
Nitrite: NEGATIVE
Protein, ur: NEGATIVE mg/dL
Specific Gravity, Urine: 1.015 (ref 1.005–1.030)
pH: 7 (ref 5.0–8.0)

## 2021-10-15 NOTE — Progress Notes (Signed)
   10/15/21  CC:  Chief Complaint  Patient presents with   Cysto    HPI: 56 year old female with personal history of microscopic hematuria who presents today for cystoscopy.  She underwent a renal ultrasound on 08/18/2021 which was unremarkable.   Last menstrual period 10/09/2018. NED. A&Ox3.   No respiratory distress   Abd soft, NT, ND Normal external genitalia with patent urethral meatus  Cystoscopy Procedure Note  Patient identification was confirmed, informed consent was obtained, and patient was prepped using Betadine solution.  Lidocaine jelly was administered per urethral meatus.    Procedure: - Flexible cystoscope introduced, without any difficulty.   - Thorough search of the bladder revealed:    Slightly narrow urethral meatus primarily at the external portion, just able to accommodate 16 [place] scope    normal urothelium    no stones    no ulcers     no tumors    no urethral polyps    no trabeculation  - Ureteral orifices were normal in position and appearance with some mild trigonitis  Post-Procedure: - Patient tolerated the procedure well  Assessment/ Plan:  1. Microscopic hematuria Cystoscopy today unremarkable other than for some mild tract nidus and slight narrowing of the external urethral meatus  Renal ultrasound also unremarkable  Recommend reevaluation every 2 to 3 years for microscopic hematuria persist or sooner if she develops gross hematuria  She is agreeable this plan  Plan for referral back to urology as above as needed  Hollice Espy, MD

## 2022-11-21 ENCOUNTER — Other Ambulatory Visit: Payer: Self-pay | Admitting: Internal Medicine

## 2022-11-21 DIAGNOSIS — Z1231 Encounter for screening mammogram for malignant neoplasm of breast: Secondary | ICD-10-CM

## 2022-11-22 ENCOUNTER — Other Ambulatory Visit: Payer: Self-pay | Admitting: Internal Medicine

## 2022-11-22 DIAGNOSIS — Z0000 Encounter for general adult medical examination without abnormal findings: Secondary | ICD-10-CM

## 2022-11-22 DIAGNOSIS — E782 Mixed hyperlipidemia: Secondary | ICD-10-CM

## 2022-11-22 DIAGNOSIS — Z8249 Family history of ischemic heart disease and other diseases of the circulatory system: Secondary | ICD-10-CM

## 2022-12-02 ENCOUNTER — Other Ambulatory Visit: Payer: 59

## 2022-12-13 ENCOUNTER — Ambulatory Visit
Admission: RE | Admit: 2022-12-13 | Discharge: 2022-12-13 | Disposition: A | Discharge status: Home / Self Care | Payer: 59 | Source: Ambulatory Visit | Attending: Internal Medicine | Admitting: Internal Medicine

## 2022-12-13 DIAGNOSIS — Z0000 Encounter for general adult medical examination without abnormal findings: Secondary | ICD-10-CM | POA: Insufficient documentation

## 2022-12-13 DIAGNOSIS — Z8249 Family history of ischemic heart disease and other diseases of the circulatory system: Secondary | ICD-10-CM | POA: Insufficient documentation

## 2022-12-13 DIAGNOSIS — E782 Mixed hyperlipidemia: Secondary | ICD-10-CM | POA: Insufficient documentation

## 2023-01-17 ENCOUNTER — Other Ambulatory Visit: Payer: Self-pay | Admitting: Pulmonary Disease

## 2023-01-17 DIAGNOSIS — R911 Solitary pulmonary nodule: Secondary | ICD-10-CM

## 2023-01-30 ENCOUNTER — Ambulatory Visit
Admission: RE | Admit: 2023-01-30 | Discharge: 2023-01-30 | Disposition: A | Discharge status: Home / Self Care | Payer: 59 | Source: Ambulatory Visit | Attending: Pulmonary Disease | Admitting: Pulmonary Disease

## 2023-01-30 DIAGNOSIS — E049 Nontoxic goiter, unspecified: Secondary | ICD-10-CM | POA: Insufficient documentation

## 2023-01-30 DIAGNOSIS — R911 Solitary pulmonary nodule: Secondary | ICD-10-CM | POA: Diagnosis present

## 2023-01-30 LAB — GLUCOSE, CAPILLARY: Glucose-Capillary: 89 mg/dL (ref 70–99)

## 2023-01-30 MED ORDER — FLUDEOXYGLUCOSE F - 18 (FDG) INJECTION
8.3100 | Freq: Once | INTRAVENOUS | Status: AC | PRN
  Administered 2023-01-30: 8.31 via INTRAVENOUS

## 2023-02-01 ENCOUNTER — Other Ambulatory Visit: Payer: Self-pay | Admitting: Internal Medicine

## 2023-02-01 DIAGNOSIS — Z1231 Encounter for screening mammogram for malignant neoplasm of breast: Secondary | ICD-10-CM

## 2023-02-01 DIAGNOSIS — R9389 Abnormal findings on diagnostic imaging of other specified body structures: Secondary | ICD-10-CM

## 2023-02-03 ENCOUNTER — Ambulatory Visit: Payer: 59

## 2023-02-03 ENCOUNTER — Other Ambulatory Visit: Payer: Self-pay | Admitting: Internal Medicine

## 2023-02-03 DIAGNOSIS — R928 Other abnormal and inconclusive findings on diagnostic imaging of breast: Secondary | ICD-10-CM

## 2023-02-13 ENCOUNTER — Ambulatory Visit
Admission: RE | Admit: 2023-02-13 | Discharge: 2023-02-13 | Disposition: A | Discharge status: Home / Self Care | Payer: 59 | Source: Ambulatory Visit | Attending: Internal Medicine | Admitting: Internal Medicine

## 2023-02-13 ENCOUNTER — Other Ambulatory Visit: Payer: Self-pay | Admitting: Internal Medicine

## 2023-02-13 DIAGNOSIS — R928 Other abnormal and inconclusive findings on diagnostic imaging of breast: Secondary | ICD-10-CM

## 2023-02-15 ENCOUNTER — Other Ambulatory Visit: Payer: Self-pay | Admitting: Internal Medicine

## 2023-02-15 DIAGNOSIS — N63 Unspecified lump in unspecified breast: Secondary | ICD-10-CM

## 2023-02-15 DIAGNOSIS — R928 Other abnormal and inconclusive findings on diagnostic imaging of breast: Secondary | ICD-10-CM

## 2023-02-17 ENCOUNTER — Ambulatory Visit
Admission: RE | Admit: 2023-02-17 | Discharge: 2023-02-17 | Disposition: A | Discharge status: Home / Self Care | Payer: 59 | Source: Ambulatory Visit | Attending: Internal Medicine | Admitting: Internal Medicine

## 2023-02-17 DIAGNOSIS — R928 Other abnormal and inconclusive findings on diagnostic imaging of breast: Secondary | ICD-10-CM | POA: Diagnosis present

## 2023-02-17 DIAGNOSIS — N63 Unspecified lump in unspecified breast: Secondary | ICD-10-CM | POA: Insufficient documentation

## 2023-02-17 DIAGNOSIS — N6012 Diffuse cystic mastopathy of left breast: Secondary | ICD-10-CM | POA: Diagnosis present

## 2023-02-17 HISTORY — PX: BREAST BIOPSY: SHX20

## 2023-02-17 MED ORDER — LIDOCAINE-EPINEPHRINE 2 %-1:100000 IJ SOLN
5.0000 mL | Freq: Once | INTRAMUSCULAR | Status: AC
  Administered 2023-02-17: 5 mL
  Filled 2023-02-17: qty 5.1

## 2023-02-17 MED ORDER — LIDOCAINE 1 % OPTIME INJ - NO CHARGE
5.0000 mL | Freq: Once | INTRAMUSCULAR | Status: AC
  Administered 2023-02-17: 5 mL
  Filled 2023-02-17: qty 6

## 2023-02-20 LAB — SURGICAL PATHOLOGY

## 2023-05-04 ENCOUNTER — Other Ambulatory Visit: Payer: Self-pay | Admitting: Internal Medicine

## 2023-05-04 DIAGNOSIS — N63 Unspecified lump in unspecified breast: Secondary | ICD-10-CM

## 2023-08-18 ENCOUNTER — Ambulatory Visit
Admission: RE | Admit: 2023-08-18 | Discharge: 2023-08-18 | Disposition: A | Discharge status: Home / Self Care | Source: Ambulatory Visit | Attending: Internal Medicine | Admitting: Internal Medicine

## 2023-08-18 DIAGNOSIS — N63 Unspecified lump in unspecified breast: Secondary | ICD-10-CM | POA: Diagnosis present

## 2023-08-25 ENCOUNTER — Other Ambulatory Visit: Payer: Self-pay | Admitting: Internal Medicine

## 2023-08-25 DIAGNOSIS — R928 Other abnormal and inconclusive findings on diagnostic imaging of breast: Secondary | ICD-10-CM

## 2023-08-25 DIAGNOSIS — M7989 Other specified soft tissue disorders: Secondary | ICD-10-CM

## 2023-12-22 MED ORDER — CEFAZOLIN SODIUM-DEXTROSE 2-4 GM/100ML-% IV SOLN
INTRAVENOUS | Status: AC
  Filled 2023-12-22: qty 100

## 2023-12-22 MED ORDER — CHLORHEXIDINE GLUCONATE 0.12 % MT SOLN
OROMUCOSAL | Status: AC
  Filled 2023-12-22: qty 15

## 2023-12-25 ENCOUNTER — Encounter: Admission: RE | Disposition: A | Payer: Self-pay | Source: Home / Self Care | Attending: Gastroenterology

## 2023-12-25 ENCOUNTER — Ambulatory Visit
Admission: RE | Admit: 2023-12-25 | Discharge: 2023-12-25 | Disposition: A | Discharge status: Home / Self Care | Attending: Gastroenterology | Admitting: Gastroenterology

## 2023-12-25 ENCOUNTER — Ambulatory Visit: Admitting: Certified Registered Nurse Anesthetist

## 2023-12-25 ENCOUNTER — Encounter: Payer: Self-pay | Admitting: Gastroenterology

## 2023-12-25 HISTORY — PX: COLONOSCOPY: SHX5424

## 2023-12-25 SURGERY — COLONOSCOPY
Anesthesia: General

## 2023-12-25 MED ORDER — PROPOFOL 500 MG/50ML IV EMUL
INTRAVENOUS | Status: DC | PRN
  Administered 2023-12-25: 140 ug/kg/min via INTRAVENOUS

## 2023-12-25 MED ORDER — LIDOCAINE HCL (CARDIAC) PF 100 MG/5ML IV SOSY
PREFILLED_SYRINGE | INTRAVENOUS | Status: DC | PRN
  Administered 2023-12-25: 100 mg via INTRAVENOUS

## 2023-12-25 MED ORDER — SODIUM CHLORIDE 0.9 % IV SOLN
INTRAVENOUS | Status: DC
  Administered 2023-12-25: 20 mL/h via INTRAVENOUS

## 2023-12-25 MED ORDER — PROPOFOL 10 MG/ML IV BOLUS
INTRAVENOUS | Status: DC | PRN
  Administered 2023-12-25: 30 mg via INTRAVENOUS
  Administered 2023-12-25: 70 mg via INTRAVENOUS
  Administered 2023-12-25: 40 mg via INTRAVENOUS

## 2023-12-25 NOTE — H&P (Signed)
 Outpatient short stay form Pre-procedure 12/25/2023  Bonnie ONEIDA Schick, MD  Primary Physician: Cleotilde Oneil FALCON, MD  Reason for visit:  Screening  History of present illness:    58 y/o lady with history of hypertension here for index screening colonoscopy. No blood thinners. No family history of GI malignancies. Mother may have had polyps. History of c-section.    Current Facility-Administered Medications:    0.9 %  sodium chloride  infusion, , Intravenous, Continuous, Kinston Magnan, Bonnie ONEIDA, MD, Last Rate: 20 mL/hr at 12/25/23 0934, 20 mL/hr at 12/25/23 0934  Medications Prior to Admission  Medication Sig Dispense Refill Last Dose/Taking   Cholecalciferol 125 MCG (5000 UT) capsule Take 5,000 Units by mouth daily.   12/24/2023   olmesartan-hydrochlorothiazide (BENICAR HCT) 20-12.5 MG tablet Take 1 tablet by mouth daily.   12/24/2023   Omega-3 1000 MG CAPS Take 1 capsule by mouth 2 (two) times a week.   12/24/2023     Allergies  Allergen Reactions   Peanut Oil Rash    *PEANUT BUTTER TO EXCESS     Past Medical History:  Diagnosis Date   Hypertension     Review of systems:  Otherwise negative.    Physical Exam  Gen: Alert, oriented. Appears stated age.  HEENT: PERRLA. Lungs: No respiratory distress CV: RRR Abd: soft, benign, no masses Ext: No edema    Planned procedures: Proceed with colonoscopy. The patient understands the nature of the planned procedure, indications, risks, alternatives and potential complications including but not limited to bleeding, infection, perforation, damage to internal organs and possible oversedation/side effects from anesthesia. The patient agrees and gives consent to proceed.  Please refer to procedure notes for findings, recommendations and patient disposition/instructions.     Bonnie ONEIDA Schick, MD [facility]

## 2023-12-25 NOTE — Anesthesia Postprocedure Evaluation (Signed)
 Anesthesia Post Note  Patient: Bonnie Ingram  Procedure(s) Performed: COLONOSCOPY  Patient location during evaluation: Endoscopy Anesthesia Type: General Level of consciousness: awake and alert Pain management: pain level controlled Vital Signs Assessment: post-procedure vital signs reviewed and stable Respiratory status: spontaneous breathing, nonlabored ventilation, respiratory function stable and patient connected to nasal cannula oxygen Cardiovascular status: blood pressure returned to baseline and stable Postop Assessment: no apparent nausea or vomiting Anesthetic complications: no   No notable events documented.   Last Vitals:  Vitals:   12/25/23 1036 12/25/23 1046  BP: (!) 123/55 127/69  Pulse: 88 71  Resp: 15 11  Temp:    SpO2: 100% 100%    Last Pain:  Vitals:   12/25/23 1046  TempSrc:   PainSc: 0-No pain                 Lendia LITTIE Mae

## 2023-12-25 NOTE — Op Note (Signed)
 [facility] Gastroenterology Patient Name: Bonnie Ingram Procedure Date: 12/25/2023 9:52 AM MRN: [id number] Account #: [id number] Date of Birth: [birth year] Admit Type: Outpatient Age: 58 Room: [facility] ENDO ROOM 3 Gender: Female Note Status: Finalized Instrument Name: Colon Scope [id number] Procedure:             Colonoscopy Indications:           Screening for colorectal malignant neoplasm Providers:             Ole Schick MD, MD Referring MD:          Oneil PHEBE [name], MD (Referring MD) Medicines:             Propofol  per Anesthesia Complications:         No immediate complications. Procedure:             Pre-Anesthesia Assessment:                        - Prior to the procedure, a History and Physical was                         performed, and patient medications and allergies were                         reviewed. The patient is competent. The risks and                         benefits of the procedure and the sedation options and                         risks were discussed with the patient. All questions                         were answered and informed consent was obtained.                         Patient identification and proposed procedure were                         verified by the physician, the nurse, the                         anesthesiologist, the anesthetist and the technician                         in the endoscopy suite. Mental Status Examination:                         alert and oriented. Airway Examination: normal                         oropharyngeal airway and neck mobility. Respiratory                         Examination: clear to auscultation. CV Examination:                         normal. Prophylactic Antibiotics: The patient does not  require prophylactic antibiotics. Prior                         Anticoagulants: The patient has taken no anticoagulant                         or antiplatelet agents. ASA Grade  Assessment: II - A                         patient with mild systemic disease. After reviewing                         the risks and benefits, the patient was deemed in                         satisfactory condition to undergo the procedure. The                         anesthesia plan was to use monitored anesthesia care                         (MAC). Immediately prior to administration of                         medications, the patient was re-assessed for adequacy                         to receive sedatives. The heart rate, respiratory                         rate, oxygen saturations, blood pressure, adequacy of                         pulmonary ventilation, and response to care were                         monitored throughout the procedure. The physical                         status of the patient was re-assessed after the                         procedure.                        After obtaining informed consent, the colonoscope was                         passed under direct vision. Throughout the procedure,                         the patient's blood pressure, pulse, and oxygen                         saturations were monitored continuously. The                         Colonoscope was introduced through the anus and  advanced to the the terminal ileum, with                         identification of the appendiceal orifice and IC                         valve. The colonoscopy was performed without                         difficulty. The patient tolerated the procedure well.                         The quality of the bowel preparation was good. The                         terminal ileum, ileocecal valve, appendiceal orifice,                         and rectum were photographed. Findings:      The perianal and digital rectal examinations were normal.      The terminal ileum appeared normal.      Internal hemorrhoids were found during retroflexion. The  hemorrhoids       were Grade I (internal hemorrhoids that do not prolapse).      The exam was otherwise without abnormality on direct and retroflexion       views. Impression:            - The examined portion of the ileum was normal.                        - Internal hemorrhoids.                        - The examination was otherwise normal on direct and                         retroflexion views.                        - No specimens collected. Recommendation:        - Discharge patient to home.                        - Resume previous diet.                        - Continue present medications.                        - Repeat colonoscopy in 10 years for screening                         purposes.                        - Return to referring physician as previously                         scheduled. Procedure Code(s):     --- Professional ---  H9878, Colorectal cancer screening; colonoscopy on                         individual not meeting criteria for high risk Diagnosis Code(s):     --- Professional ---                        Z12.11, Encounter for screening for malignant neoplasm                         of colon                        K64.0, First degree hemorrhoids CPT copyright 2022 [employer]. All rights reserved. The codes documented in this report are preliminary and upon coder review may  be revised to meet current compliance requirements. Ole Schick MD, MD 12/25/2023 10:30:35 AM Number of Addenda: 0 Note Initiated On: 12/25/2023 9:52 AM Scope Withdrawal Time: 0 hours 7 minutes 11 seconds  Total Procedure Duration: 0 hours 13 minutes 37 seconds  Estimated Blood Loss:  Estimated blood loss: none.      [facility]

## 2023-12-25 NOTE — Transfer of Care (Signed)
 Immediate Anesthesia Transfer of Care Note  Patient: Bonnie Ingram  Procedure(s) Performed: COLONOSCOPY  Patient Location: Endoscopy Unit  Anesthesia Type:General  Level of Consciousness: drowsy  Airway & Oxygen Therapy: Patient Spontanous Breathing  Post-op Assessment: Report given to RN and Post -op Vital signs reviewed and stable  Post vital signs: Reviewed and stable  Last Vitals:  Vitals Value Taken Time  BP 134/61 12/25/23 10:26  Temp 36.4 C 12/25/23 10:26  Pulse 103 12/25/23 10:26  Resp 19 12/25/23 10:26  SpO2 99 % 12/25/23 10:26    Last Pain:  Vitals:   12/25/23 1026  TempSrc: Tympanic  PainSc: Asleep         Complications: No notable events documented.

## 2023-12-25 NOTE — Interval H&P Note (Signed)
 History and Physical Interval Note:  12/25/2023 9:59 AM  Bonnie Ingram  has presented today for surgery, with the diagnosis of Colon cancer screening (Z12.11) Family history of colonic polyps (Z83.719).  The various methods of treatment have been discussed with the patient and family. After consideration of risks, benefits and other options for treatment, the patient has consented to  Procedure(s): COLONOSCOPY (N/A) as a surgical intervention.  The patient's history has been reviewed, patient examined, no change in status, stable for surgery.  I have reviewed the patient's chart and labs.  Questions were answered to the patient's satisfaction.     Ole ONEIDA Schick  Ok to proceed with colonoscopy

## 2023-12-25 NOTE — Anesthesia Preprocedure Evaluation (Signed)
 Anesthesia Evaluation  Patient identified by MRN, date of birth, ID band Patient awake    Reviewed: Allergy & Precautions, NPO status , Patient's Chart, lab work & pertinent test results  History of Anesthesia Complications Negative for: history of anesthetic complications  Airway Mallampati: II  TM Distance: >3 FB Neck ROM: full    Dental no notable dental hx.    Pulmonary neg pulmonary ROS   Pulmonary exam normal        Cardiovascular hypertension, On Medications negative cardio ROS Normal cardiovascular exam     Neuro/Psych negative neurological ROS  negative psych ROS   GI/Hepatic negative GI ROS, Neg liver ROS,,,  Endo/Other  negative endocrine ROS    Renal/GU negative Renal ROS  negative genitourinary   Musculoskeletal   Abdominal   Peds  Hematology negative hematology ROS (+)   Anesthesia Other Findings Past Medical History: No date: Hypertension  Past Surgical History: 1989: BREAST BIOPSY; Right     Comment:  Benign 02/17/2023: BREAST BIOPSY; Left     Comment:  US  LT BREAST BX W LOC DEV 1ST LESION IMG BX SPEC US                GUIDE 1/31/202FAT NECROSIS. - FIBROCYSTIC CHANGES WITH               USUAL DUCTAL HYPERPLASIA. -5 [facility] 1993: CESAREAN SECTION 2018: TOOTH EXTRACTION     Reproductive/Obstetrics negative OB ROS                              Anesthesia Physical Anesthesia Plan  ASA: 2  Anesthesia Plan: General   Post-op Pain Management: Minimal or no pain anticipated   Induction: Intravenous  PONV Risk Score and Plan: 2 and Propofol  infusion and TIVA  Airway Management Planned: Natural Airway and Nasal Cannula  Additional Equipment:   Intra-op Plan:   Post-operative Plan:   Informed Consent: I have reviewed the patients History and Physical, chart, labs and discussed the procedure including the risks, benefits and alternatives for the  proposed anesthesia with the patient or authorized representative who has indicated his/her understanding and acceptance.     Dental Advisory Given  Plan Discussed with: Anesthesiologist, CRNA and Surgeon  Anesthesia Plan Comments: (Patient consented for risks of anesthesia including but not limited to:  - adverse reactions to medications - risk of airway placement if required - damage to eyes, teeth, lips or other oral mucosa - nerve damage due to positioning  - sore throat or hoarseness - Damage to heart, brain, nerves, lungs, other parts of body or loss of life  Patient voiced understanding and assent.)        Anesthesia Quick Evaluation

## 2024-02-19 ENCOUNTER — Other Ambulatory Visit

## 2024-02-19 ENCOUNTER — Inpatient Hospital Stay: Admission: RE | Admit: 2024-02-19 | Source: Ambulatory Visit

## 2024-02-21 ENCOUNTER — Other Ambulatory Visit: Payer: Self-pay | Admitting: Pulmonary Disease

## 2024-02-21 DIAGNOSIS — R911 Solitary pulmonary nodule: Secondary | ICD-10-CM

## 2024-02-26 ENCOUNTER — Other Ambulatory Visit: Payer: Self-pay

## 2024-02-26 ENCOUNTER — Inpatient Hospital Stay: Admission: RE | Admit: 2024-02-26 | Payer: Self-pay

## 2024-03-19 ENCOUNTER — Ambulatory Visit
